# Patient Record
Sex: Female | Born: 1965 | Race: White | Hispanic: No | Marital: Married | Smoking: Current every day smoker
Health system: Southern US, Community
[De-identification: ages and names within clinical notes are randomized; demographics above are authoritative.]

## PROBLEM LIST (undated history)

## (undated) DIAGNOSIS — D649 Anemia, unspecified: Secondary | ICD-10-CM

## (undated) HISTORY — PX: CHOLECYSTECTOMY: SHX55

## (undated) HISTORY — PX: ABDOMINAL HYSTERECTOMY: SHX81

---

## 1997-12-14 ENCOUNTER — Other Ambulatory Visit: Admission: RE | Admit: 1997-12-14 | Discharge: 1997-12-14 | Payer: Self-pay | Admitting: Obstetrics & Gynecology

## 1998-02-26 ENCOUNTER — Inpatient Hospital Stay (HOSPITAL_COMMUNITY): Admission: AD | Admit: 1998-02-26 | Discharge: 1998-02-26 | Payer: Self-pay | Admitting: Obstetrics & Gynecology

## 1998-05-09 ENCOUNTER — Inpatient Hospital Stay (HOSPITAL_COMMUNITY): Admission: AD | Admit: 1998-05-09 | Discharge: 1998-05-11 | Payer: Self-pay | Admitting: Obstetrics and Gynecology

## 1998-06-21 ENCOUNTER — Other Ambulatory Visit: Admission: RE | Admit: 1998-06-21 | Discharge: 1998-06-21 | Payer: Self-pay | Admitting: Obstetrics & Gynecology

## 2001-08-04 ENCOUNTER — Other Ambulatory Visit: Admission: RE | Admit: 2001-08-04 | Discharge: 2001-08-04 | Payer: Self-pay | Admitting: Obstetrics & Gynecology

## 2002-08-31 ENCOUNTER — Observation Stay (HOSPITAL_COMMUNITY): Admission: RE | Admit: 2002-08-31 | Discharge: 2002-09-01 | Payer: Self-pay | Admitting: Obstetrics & Gynecology

## 2006-07-22 ENCOUNTER — Emergency Department: Payer: Self-pay | Admitting: Emergency Medicine

## 2014-05-17 ENCOUNTER — Emergency Department: Payer: Self-pay | Admitting: Emergency Medicine

## 2015-09-27 ENCOUNTER — Emergency Department (HOSPITAL_COMMUNITY)
Admission: EM | Admit: 2015-09-27 | Discharge: 2015-09-27 | Disposition: A | Discharge status: Home / Self Care | Payer: Self-pay | Attending: Emergency Medicine | Admitting: Emergency Medicine

## 2015-09-27 ENCOUNTER — Encounter (HOSPITAL_COMMUNITY): Payer: Self-pay

## 2015-09-27 DIAGNOSIS — F17200 Nicotine dependence, unspecified, uncomplicated: Secondary | ICD-10-CM | POA: Insufficient documentation

## 2015-09-27 DIAGNOSIS — R319 Hematuria, unspecified: Secondary | ICD-10-CM

## 2015-09-27 DIAGNOSIS — N390 Urinary tract infection, site not specified: Secondary | ICD-10-CM | POA: Insufficient documentation

## 2015-09-27 DIAGNOSIS — R109 Unspecified abdominal pain: Secondary | ICD-10-CM

## 2015-09-27 LAB — URINALYSIS, ROUTINE W REFLEX MICROSCOPIC
Bilirubin Urine: NEGATIVE
Glucose, UA: NEGATIVE mg/dL
Ketones, ur: NEGATIVE mg/dL
LEUKOCYTES UA: NEGATIVE
NITRITE: POSITIVE — AB
PROTEIN: NEGATIVE mg/dL
SPECIFIC GRAVITY, URINE: 1.015 (ref 1.005–1.030)
pH: 5.5 (ref 5.0–8.0)

## 2015-09-27 LAB — URINE MICROSCOPIC-ADD ON

## 2015-09-27 MED ORDER — CEPHALEXIN 500 MG PO CAPS: 500.0000 mg | ORAL_CAPSULE | Freq: Four times a day (QID) | ORAL | Status: DC

## 2015-09-27 MED ORDER — HYDROCODONE-ACETAMINOPHEN 5-325 MG PO TABS: 1.0000 | ORAL_TABLET | ORAL | Status: DC | PRN

## 2015-09-27 NOTE — ED Notes (Signed)
Pt states he understands instructions. Home stable with family. 

## 2015-09-27 NOTE — ED Provider Notes (Signed)
CSN: [id number]     Arrival date & time 09/27/15  0741 History   First MD Initiated Contact with Patient 09/27/15 0932     Chief Complaint  Patient presents with  . Flank Pain  . Hematuria     (Consider location/radiation/quality/duration/timing/severity/associated sxs/prior Treatment) Patient is a 50 y.o. female presenting with flank pain and hematuria. The history is provided by the patient and medical records.  Flank Pain  Hematuria  50 year old female with no significant past medical history presenting to the ED for hematuria and left flank pain. Patient reports yesterday morning she got up from bed and went to urinate and noticed some blood in her urine. She denies any passage of clots. She states this is cleared over the past day but now she has some dull, aching, left flank pain. She denies any fever or chills.  No nausea/vomiting.  No diarrhea. She does report some pressure when urinating but states this is not that abnormal for her due to her prior hysterectomy.  She denies hx of kidney stones.  No prior abdominal surgery. Patient reports similar symptoms in the past and was diagnosed with a UTI.  History reviewed. No pertinent past medical history. History reviewed. No pertinent past surgical history. No family history on file. Social History  Substance Use Topics  . Smoking status: Current Every Day Smoker  . Smokeless tobacco: None  . Alcohol Use: None   OB History    No data available     Review of Systems  Genitourinary: Positive for hematuria and flank pain.  All other systems reviewed and are negative.     Allergies  Review of patient's allergies indicates no known allergies.  Home Medications   Prior to Admission medications   Not on File   BP 131/76 mmHg  Pulse 102  Temp(Src) 97.7 F (36.5 C) (Oral)  Resp 18  Ht 5\' 3"  (1.6 m)  SpO2 97%   Physical Exam  Constitutional: She is oriented to person, place, and time. She appears well-developed and  well-nourished.  Comfortable during exam, no distress  HENT:  Head: Normocephalic and atraumatic.  Mouth/Throat: Oropharynx is clear and moist.  Eyes: Conjunctivae and EOM are normal. Pupils are equal, round, and reactive to light.  Neck: Normal range of motion.  Cardiovascular: Normal rate, regular rhythm and normal heart sounds.   Pulmonary/Chest: Effort normal and breath sounds normal. No respiratory distress. She has no wheezes.  Abdominal: Soft. Bowel sounds are normal. There is no tenderness. There is CVA tenderness (mild, left).  Musculoskeletal: Normal range of motion.  Neurological: She is alert and oriented to person, place, and time.  Skin: Skin is warm and dry.  Psychiatric: She has a normal mood and affect.  Nursing note and vitals reviewed.   ED Course  Procedures (including critical care time) Labs Review Labs Reviewed  URINALYSIS, ROUTINE W REFLEX MICROSCOPIC (NOT AT [facility]) - Abnormal; Notable for the following:    APPearance CLOUDY (*)    Hgb urine dipstick LARGE (*)    Nitrite POSITIVE (*)    All other components within normal limits  URINE MICROSCOPIC-ADD ON - Abnormal; Notable for the following:    Squamous Epithelial / LPF 6-30 (*)    Bacteria, UA MANY (*)    Crystals CA OXALATE CRYSTALS (*)    All other components within normal limits    Imaging Review No results found. I have personally reviewed and evaluated these images and lab results as part of my medical decision-making.  EKG Interpretation None      MDM   Final diagnoses:  UTI (lower urinary tract infection)  Hematuria  Flank pain   50 year old female here with hematuria yesterday and now left flank pain. Patient is afebrile, nontoxic. Her vital signs are stable. She appears comfortable.  She has mild left CVA tenderness on exam. UA is infectious with many bacteria, nitrite positive. There some blood noted, however this is likely due to her concurrent infection.  Patient has no history of  kidney stones, is otherwise healthy, and hemodynamically stable here in the ED.  Will start on Keflex for possibly developing pyelonephritis. Rx Vicodin for pain. Encouraged to follow-up with her primary care doctor.  Discussed plan with patient, he/she acknowledged understanding and agreed with plan of care.  Return precautions given for new or worsening symptoms.  Garlon Hatchet, PA-C 09/27/15 1032  Garlon Hatchet, PA-C 09/27/15 1037  Pricilla Loveless, MD 09/30/15 [phone]

## 2015-09-27 NOTE — ED Notes (Addendum)
Patient complains of 1 episode of hematuria yesterday and this am 0300 developed left flank pain. Pain worse with any movement. No nausea, no vomiting

## 2015-09-27 NOTE — Discharge Instructions (Signed)
Take the prescribed medication as directed.  Use caution when taking vicodin-- it can make you sleepy/drowsy. Follow-up with your primary care doctor. Return to the ED for new or worsening symptoms.

## 2015-09-29 LAB — URINE CULTURE: Culture: >=100000 — AB

## 2015-09-30 ENCOUNTER — Telehealth (HOSPITAL_BASED_OUTPATIENT_CLINIC_OR_DEPARTMENT_OTHER): Payer: Self-pay

## 2015-09-30 NOTE — Telephone Encounter (Signed)
Post ED Visit - Positive Culture Follow-up  Culture report reviewed by antimicrobial stewardship pharmacist:  []  Martha Welch, Pharm.D. []  Martha Welch, Pharm.D., BCPS []  Martha Welch, Pharm.D. []  Martha Welch, Pharm.D., BCPS [x]  Martha Welch, [address] BoulevardPharm.D., BCPS, AAHIVP []  Martha Welch, Pharm.D., BCPS, AAHIVP []  Martha Welch, Pharm.D. []  Martha Welch, [place]D.  Positive urine culture Treated with Cephalexin, organism sensitive to the same and no further patient follow-up is required at this time.  Martha Welch, Martha Welch 09/30/2015, 10:35 AM

## 2016-10-31 ENCOUNTER — Emergency Department: Payer: No Typology Code available for payment source

## 2016-10-31 ENCOUNTER — Emergency Department
Admission: EM | Admit: 2016-10-31 | Discharge: 2016-10-31 | Disposition: A | Discharge status: Home / Self Care | Payer: No Typology Code available for payment source | Attending: Emergency Medicine | Admitting: Emergency Medicine

## 2016-10-31 DIAGNOSIS — Y939 Activity, unspecified: Secondary | ICD-10-CM | POA: Insufficient documentation

## 2016-10-31 DIAGNOSIS — F17200 Nicotine dependence, unspecified, uncomplicated: Secondary | ICD-10-CM | POA: Diagnosis not present

## 2016-10-31 DIAGNOSIS — N132 Hydronephrosis with renal and ureteral calculous obstruction: Secondary | ICD-10-CM | POA: Diagnosis not present

## 2016-10-31 DIAGNOSIS — Y929 Unspecified place or not applicable: Secondary | ICD-10-CM | POA: Insufficient documentation

## 2016-10-31 DIAGNOSIS — S39012A Strain of muscle, fascia and tendon of lower back, initial encounter: Secondary | ICD-10-CM | POA: Diagnosis not present

## 2016-10-31 DIAGNOSIS — Y998 Other external cause status: Secondary | ICD-10-CM | POA: Insufficient documentation

## 2016-10-31 DIAGNOSIS — S3992XA Unspecified injury of lower back, initial encounter: Secondary | ICD-10-CM | POA: Diagnosis present

## 2016-10-31 HISTORY — DX: Anemia, unspecified: D64.9

## 2016-10-31 LAB — URINALYSIS, COMPLETE (UACMP) WITH MICROSCOPIC
BACTERIA UA: NONE SEEN
BILIRUBIN URINE: NEGATIVE
Glucose, UA: NEGATIVE mg/dL
KETONES UR: NEGATIVE mg/dL
LEUKOCYTES UA: NEGATIVE
NITRITE: NEGATIVE
PH: 5 (ref 5.0–8.0)
Protein, ur: 30 mg/dL — AB
Specific Gravity, Urine: 1.015 (ref 1.005–1.030)

## 2016-10-31 MED ORDER — TAMSULOSIN HCL 0.4 MG PO CAPS: 0.4000 mg | ORAL_CAPSULE | Freq: Every day | ORAL | 0 refills | Status: AC

## 2016-10-31 MED ORDER — KETOROLAC TROMETHAMINE 30 MG/ML IJ SOLN
30.0000 mg | Freq: Once | INTRAMUSCULAR | Status: AC
  Administered 2016-10-31: 30 mg via INTRAMUSCULAR
  Filled 2016-10-31: qty 1

## 2016-10-31 MED ORDER — CYCLOBENZAPRINE HCL 5 MG PO TABS: 5.0000 mg | ORAL_TABLET | Freq: Three times a day (TID) | ORAL | 0 refills | Status: DC | PRN

## 2016-10-31 MED ORDER — CIPROFLOXACIN HCL 500 MG PO TABS: 500.0000 mg | ORAL_TABLET | Freq: Two times a day (BID) | ORAL | 0 refills | Status: AC

## 2016-10-31 MED ORDER — KETOROLAC TROMETHAMINE 10 MG PO TABS: 10.0000 mg | ORAL_TABLET | Freq: Four times a day (QID) | ORAL | 0 refills | Status: DC | PRN
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
## 2018-12-05 ENCOUNTER — Emergency Department (HOSPITAL_COMMUNITY)
Admission: EM | Admit: 2018-12-05 | Discharge: 2018-12-05 | Disposition: A | Discharge status: Home / Self Care | Payer: Self-pay | Attending: Emergency Medicine | Admitting: Emergency Medicine

## 2018-12-05 ENCOUNTER — Encounter (HOSPITAL_COMMUNITY): Payer: Self-pay | Admitting: Emergency Medicine

## 2018-12-05 ENCOUNTER — Emergency Department (HOSPITAL_COMMUNITY): Payer: Self-pay

## 2018-12-05 ENCOUNTER — Other Ambulatory Visit: Payer: Self-pay

## 2018-12-05 DIAGNOSIS — Z5321 Procedure and treatment not carried out due to patient leaving prior to being seen by health care provider: Secondary | ICD-10-CM | POA: Insufficient documentation

## 2018-12-05 LAB — CBC
HCT: 44.9 % (ref 36.0–46.0)
Hemoglobin: 14.3 g/dL (ref 12.0–15.0)
MCH: 28 pg (ref 26.0–34.0)
MCHC: 31.8 g/dL (ref 30.0–36.0)
MCV: 87.9 fL (ref 80.0–100.0)
Platelets: 306 10*3/uL (ref 150–400)
RBC: 5.11 MIL/uL (ref 3.87–5.11)
RDW: 15.1 % (ref 11.5–15.5)
WBC: 15.1 10*3/uL — ABNORMAL HIGH (ref 4.0–10.5)
nRBC: 0 % (ref 0.0–0.2)

## 2018-12-05 LAB — BASIC METABOLIC PANEL
Anion gap: 9 (ref 5–15)
BUN: 6 mg/dL (ref 6–20)
CO2: 23 mmol/L (ref 22–32)
Calcium: 8.9 mg/dL (ref 8.9–10.3)
Chloride: 110 mmol/L (ref 98–111)
Creatinine, Ser: 0.72 mg/dL (ref 0.44–1.00)
GFR calc Af Amer: >60 mL/min (ref >60)
GFR calc non Af Amer: >60 mL/min (ref >60)
Glucose, Bld: 129 mg/dL — ABNORMAL HIGH (ref 70–99)
Potassium: 3.1 mmol/L — ABNORMAL LOW (ref 3.5–5.1)
Sodium: 142 mmol/L (ref 135–145)

## 2018-12-05 LAB — TROPONIN I (HIGH SENSITIVITY): Troponin I (High Sensitivity): 6 ng/L (ref <18)
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
## 2020-02-29 ENCOUNTER — Other Ambulatory Visit: Payer: Self-pay

## 2020-02-29 ENCOUNTER — Emergency Department
Admission: EM | Admit: 2020-02-29 | Discharge: 2020-02-29 | Disposition: A | Discharge status: Home / Self Care | Payer: Self-pay | Attending: Emergency Medicine | Admitting: Emergency Medicine

## 2020-02-29 DIAGNOSIS — F17200 Nicotine dependence, unspecified, uncomplicated: Secondary | ICD-10-CM | POA: Insufficient documentation

## 2020-02-29 DIAGNOSIS — H66003 Acute suppurative otitis media without spontaneous rupture of ear drum, bilateral: Secondary | ICD-10-CM | POA: Insufficient documentation

## 2020-02-29 MED ORDER — CEFDINIR 300 MG PO CAPS: 300.0000 mg | ORAL_CAPSULE | Freq: Two times a day (BID) | ORAL | 0 refills | Status: AC

## 2020-02-29 NOTE — ED Triage Notes (Signed)
Pt here with bilateral ear infx. Pt states this happens often. Pt in triage crying due to the pain.

## 2020-02-29 NOTE — ED Provider Notes (Signed)
[facility] Emergency Department Provider Note  ____________________________________________   First MD Initiated Contact with Patient 02/29/20 1552     (approximate)  I have reviewed the triage vital signs and the nursing notes.   HISTORY  Chief Complaint Otalgia  HPI Martha Welch is a 54 y.o. female who presents to the emergency department for evaluation of bilateral ear pain that has been present since Saturday. Patient states that she has a history of bilateral ear infections, but that she has not had to take antibiotics for them in several years. She states that she has tried [facility] powder and ibuprofen without any symptom relief. Pain is rated 10/10 in the bilateral ears, states that one is not worse than the other.         Past Medical History:  Diagnosis Date  . Anemia     There are no problems to display for this patient.   Past Surgical History:  Procedure Laterality Date  . ABDOMINAL HYSTERECTOMY    . CESAREAN SECTION    . CHOLECYSTECTOMY      Prior to Admission medications   Medication Sig Start Date End Date Taking? Authorizing Provider  cefdinir (OMNICEF) 300 MG capsule Take 1 capsule (300 mg total) by mouth 2 (two) times daily for 10 days. 02/29/20 03/10/20  Lucy Chris, PA  cyclobenzaprine (FLEXERIL) 5 MG tablet Take 1 tablet (5 mg total) by mouth 3 (three) times daily as needed. 10/31/16   Little, Traci M, PA-C  HYDROcodone-acetaminophen (NORCO/VICODIN) 5-325 MG tablet Take 1 tablet by mouth every 4 (four) hours as needed. 09/27/15   Garlon Hatchet, PA-C    Allergies Patient has no known allergies.  No family history on file.  Social History Social History   Tobacco Use  . Smoking status: Current Every Day Smoker    Packs/day: 1.50  . Smokeless tobacco: Never Used  Vaping Use  . Vaping Use: Never used  Substance Use Topics  . Alcohol use: Yes    Comment: socially   . Drug use: No    Review of  Systems Constitutional: No fever/chills Eyes: No visual changes. ENT: + biLateral ear pain, no sore throat. Cardiovascular: Denies chest pain. Respiratory: Denies shortness of breath. Gastrointestinal: No abdominal pain.  No nausea, no vomiting.  No diarrhea.  No constipation. Genitourinary: Negative for dysuria. Musculoskeletal: Negative for back pain. Skin: Negative for rash. Neurological: Negative for headaches, focal weakness or numbness.  ____________________________________________   PHYSICAL EXAM:  VITAL SIGNS: ED Triage Vitals [02/29/20 1431]  Enc Vitals Group     BP (!) 158/79     Pulse Rate (!) 114     Resp 18     Temp 98.9 F (37.2 C)     Temp Source Oral     SpO2 100 %     Weight 128 lb (58.1 kg)     Height 5\' 3"  (1.6 m)     Head Circumference      Peak Flow      Pain Score 10     Pain Loc      Pain Edu?      Excl. in GC?     Constitutional: Alert and oriented. Well appearing and in no acute distress. Eyes: Conjunctivae are normal. PERRL. EOMI. Head: Atraumatic. Nose: No congestion/rhinnorhea. Ears: The left TM injected with a fluid line appreciated, but no bulging. The right TM demonstrates mild bulging with loss of landmark identification with erythematous injection. The bilateral canals are  unremarkable. Mouth/Throat: Mucous membranes are moist.  Oropharynx non-erythematous. Neck: No stridor.   Lymphatic: No cervical lymphadenopathy. Cardiovascular: Normal rate, regular rhythm. Grossly normal heart sounds.  Good peripheral circulation. Respiratory: Normal respiratory effort.  No retractions. Lungs CTAB. Neurologic:  Normal speech and language. No gait instability. Skin:  Skin is warm, dry and intact. No rash noted. Psychiatric: Mood and affect are normal. Speech and behavior are normal.  ____________________________________________   INITIAL IMPRESSION / ASSESSMENT AND PLAN / ED COURSE  As part of my medical decision making, I reviewed the  following data within the electronic MEDICAL RECORD NUMBER Nursing notes reviewed and incorporated        Patient is a 54 year old female who presents to the emergency department for evaluation of bilateral ear pain. Physical exam is consistent with bilateral acute otitis media given injection and mild bulging noted on the right. The patient states that she cannot take Augmentin because every time she takes it, she gets a yeast infection. She was offered Diflucan with this prescription but she refused it. She was then prescribed cefdinir as second line option, and is amenable with this plan. She will take ibuprofen and Tylenol supplemental for her symptoms. She will return to the emergency department if she has any worsening of symptoms.      ____________________________________________   FINAL CLINICAL IMPRESSION(S) / ED DIAGNOSES  Final diagnoses:  Non-recurrent acute suppurative otitis media of both ears without spontaneous rupture of tympanic membranes     ED Discharge Orders         Ordered    cefdinir (OMNICEF) 300 MG capsule  2 times daily        02/29/20 1605          *Please note:  Martha Welch was evaluated in Emergency Department on 02/29/2020 for the symptoms described in the history of present illness. She was evaluated in the context of the global COVID-19 pandemic, which necessitated consideration that the patient might be at risk for infection with the SARS-CoV-2 virus that causes COVID-19. Institutional protocols and algorithms that pertain to the evaluation of patients at risk for COVID-19 are in a state of rapid change based on information released by regulatory bodies including the CDC and federal and state organizations. These policies and algorithms were followed during the patient's care in the ED.  Some ED evaluations and interventions may be delayed as a result of limited staffing during and the pandemic.*   Note:  This document was prepared using Dragon voice  recognition software and may include unintentional dictation errors.    Lucy Chris, PA 02/29/20 Joseph Pierini    Shaune Pollack, MD 03/01/20 1734

## 2020-02-29 NOTE — ED Notes (Signed)
Pt discharged home after verbalizing understanding of discharge instructions; nad noted. 

## 2020-04-10 ENCOUNTER — Encounter: Payer: Self-pay | Admitting: Emergency Medicine

## 2020-04-10 ENCOUNTER — Emergency Department: Payer: Self-pay

## 2020-04-10 ENCOUNTER — Other Ambulatory Visit: Payer: Self-pay

## 2020-04-10 ENCOUNTER — Emergency Department
Admission: EM | Admit: 2020-04-10 | Discharge: 2020-04-10 | Disposition: A | Discharge status: Home / Self Care | Payer: Self-pay | Attending: Emergency Medicine | Admitting: Emergency Medicine

## 2020-04-10 DIAGNOSIS — F17200 Nicotine dependence, unspecified, uncomplicated: Secondary | ICD-10-CM | POA: Insufficient documentation

## 2020-04-10 DIAGNOSIS — R109 Unspecified abdominal pain: Secondary | ICD-10-CM

## 2020-04-10 DIAGNOSIS — N10 Acute tubulo-interstitial nephritis: Secondary | ICD-10-CM | POA: Insufficient documentation

## 2020-04-10 DIAGNOSIS — I700 Atherosclerosis of aorta: Secondary | ICD-10-CM | POA: Insufficient documentation

## 2020-04-10 DIAGNOSIS — N12 Tubulo-interstitial nephritis, not specified as acute or chronic: Secondary | ICD-10-CM

## 2020-04-10 DIAGNOSIS — Z90710 Acquired absence of both cervix and uterus: Secondary | ICD-10-CM | POA: Insufficient documentation

## 2020-04-10 DIAGNOSIS — Z9049 Acquired absence of other specified parts of digestive tract: Secondary | ICD-10-CM | POA: Insufficient documentation

## 2020-04-10 LAB — LACTIC ACID, PLASMA: Lactic Acid, Venous: 1.4 mmol/L (ref 0.5–1.9)

## 2020-04-10 LAB — CBC
HCT: 42.5 % (ref 36.0–46.0)
Hemoglobin: 13.7 g/dL (ref 12.0–15.0)
MCH: 29.2 pg (ref 26.0–34.0)
MCHC: 32.2 g/dL (ref 30.0–36.0)
MCV: 90.6 fL (ref 80.0–100.0)
Platelets: 201 10*3/uL (ref 150–400)
RBC: 4.69 MIL/uL (ref 3.87–5.11)
RDW: 14.2 % (ref 11.5–15.5)
WBC: 14.9 10*3/uL — ABNORMAL HIGH (ref 4.0–10.5)
nRBC: 0 % (ref 0.0–0.2)

## 2020-04-10 LAB — URINALYSIS, COMPLETE (UACMP) WITH MICROSCOPIC
Bilirubin Urine: NEGATIVE
Glucose, UA: NEGATIVE mg/dL
Ketones, ur: NEGATIVE mg/dL
Nitrite: POSITIVE — AB
Protein, ur: 100 mg/dL — AB
Specific Gravity, Urine: 1.012 (ref 1.005–1.030)
WBC, UA: >50 WBC/hpf — ABNORMAL HIGH (ref 0–5)
pH: 5 (ref 5.0–8.0)

## 2020-04-10 LAB — BASIC METABOLIC PANEL
Anion gap: 11 (ref 5–15)
BUN: 9 mg/dL (ref 6–20)
CO2: 20 mmol/L — ABNORMAL LOW (ref 22–32)
Calcium: 8.3 mg/dL — ABNORMAL LOW (ref 8.9–10.3)
Chloride: 104 mmol/L (ref 98–111)
Creatinine, Ser: 0.73 mg/dL (ref 0.44–1.00)
GFR, Estimated: >60 mL/min (ref >60)
Glucose, Bld: 192 mg/dL — ABNORMAL HIGH (ref 70–99)
Potassium: 3.5 mmol/L (ref 3.5–5.1)
Sodium: 135 mmol/L (ref 135–145)

## 2020-04-10 MED ORDER — SODIUM CHLORIDE 0.9 % IV BOLUS
1000.0000 mL | Freq: Once | INTRAVENOUS | Status: AC
  Administered 2020-04-10: 1000 mL via INTRAVENOUS

## 2020-04-10 MED ORDER — LACTATED RINGERS IV BOLUS: 1000.0000 mL | Freq: Once | INTRAVENOUS | Status: DC

## 2020-04-10 MED ORDER — CEPHALEXIN 500 MG PO CAPS: 500.0000 mg | ORAL_CAPSULE | Freq: Four times a day (QID) | ORAL | 0 refills | Status: AC

## 2020-04-10 MED ORDER — ACETAMINOPHEN 325 MG PO TABS
650.0000 mg | ORAL_TABLET | Freq: Once | ORAL | Status: AC
  Administered 2020-04-10: 650 mg via ORAL
  Filled 2020-04-10: qty 2

## 2020-04-10 MED ORDER — SODIUM CHLORIDE 0.9 % IV SOLN
1.0000 g | Freq: Once | INTRAVENOUS | Status: AC
  Administered 2020-04-10: 1 g via INTRAVENOUS
  Filled 2020-04-10: qty 10

## 2020-04-10 NOTE — ED Provider Notes (Signed)
[facility] Emergency Department Provider Note ____________________________________________   Event Date/Time   First MD Initiated Contact with Patient 04/10/20 1958     (approximate)  I have reviewed the triage vital signs and the nursing notes.  HISTORY  Chief Complaint Flank Pain   HPI Martha Welch is a 54 y.o. femalewho presents to the ED for evaluation of flank pain.  Chart review indicates history of ureteral stones.  Patient reports being in her typical state of health until developing right-sided flank pain upon awakening this morning.  She denies any dysuria, hematuria, frontal abdominal pain, emesis, diarrhea, vaginal discharge or bleeding.  Denies any subjective fevers or chills at home, but was noted to be febrile in triage.  Currently reports 2/10 right-sided flank pain is her only complaint.  Patient reports last finishing antibiotics greater than 1 month ago for a "double ear infection."  11/15 ED visit for this noted.   Past Medical History:  Diagnosis Date  . Anemia     There are no problems to display for this patient.   Past Surgical History:  Procedure Laterality Date  . ABDOMINAL HYSTERECTOMY    . CESAREAN SECTION    . CHOLECYSTECTOMY      Prior to Admission medications   Medication Sig Start Date End Date Taking? Authorizing Provider  cephALEXin (KEFLEX) 500 MG capsule Take 1 capsule (500 mg total) by mouth 4 (four) times daily for 7 days. 04/10/20 04/17/20  Delton Prairie, MD  cyclobenzaprine (FLEXERIL) 5 MG tablet Take 1 tablet (5 mg total) by mouth 3 (three) times daily as needed. 10/31/16   Little, Traci M, PA-C  HYDROcodone-acetaminophen (NORCO/VICODIN) 5-325 MG tablet Take 1 tablet by mouth every 4 (four) hours as needed. 09/27/15   Garlon Hatchet, PA-C    Allergies Amoxicillin  No family history on file.  Social History Social History   Tobacco Use  . Smoking status: Current Every Day Smoker    Packs/day:  1.50  . Smokeless tobacco: Never Used  Vaping Use  . Vaping Use: Never used  Substance Use Topics  . Alcohol use: Yes    Comment: socially   . Drug use: No    Review of Systems  Constitutional: No fever/chills Eyes: No visual changes. ENT: No sore throat. Cardiovascular: Denies chest pain. Respiratory: Denies shortness of breath. Gastrointestinal: No abdominal pain.  No nausea, no vomiting.  No diarrhea.  No constipation. Positive for right-sided flank pain. Genitourinary: Negative for dysuria. Musculoskeletal: Negative for back pain. Skin: Negative for rash. Neurological: Negative for headaches, focal weakness or numbness.  ____________________________________________   PHYSICAL EXAM:  VITAL SIGNS: Vitals:   04/10/20 1645 04/10/20 2001  BP: 140/75 101/65  Pulse: (!) 120 99  Resp: 20 16  Temp: (!) 101.4 F (38.6 C) 98.2 F (36.8 C)  SpO2: 96% 99%     Constitutional: Alert and oriented. Well appearing and in no acute distress.  Sitting up on the side of the bed, conversational in full sentences without distress. Eyes: Conjunctivae are normal. PERRL. EOMI. Head: Atraumatic. Nose: No congestion/rhinnorhea. Mouth/Throat: Mucous membranes are dry.  Oropharynx non-erythematous. Neck: No stridor. No cervical spine tenderness to palpation. Cardiovascular: Tachycardic rate, regular rhythm. Grossly normal heart sounds.  Good peripheral circulation. Respiratory: Normal respiratory effort.  No retractions. Lungs CTAB. Gastrointestinal: Soft , nondistended, nontender to palpation. No CVA tenderness. Musculoskeletal: No lower extremity tenderness nor edema.  No joint effusions. No signs of acute trauma. Neurologic:  Normal speech and language.  No gross focal neurologic deficits are appreciated. No gait instability noted. Skin:  Skin is warm, dry and intact. No rash noted. Psychiatric: Mood and affect are normal. Speech and behavior are  normal.  ____________________________________________   LABS (all labs ordered are listed, but only abnormal results are displayed)  Labs Reviewed  URINALYSIS, COMPLETE (UACMP) WITH MICROSCOPIC - Abnormal; Notable for the following components:      Result Value   Color, Urine YELLOW (*)    APPearance CLOUDY (*)    Hgb urine dipstick SMALL (*)    Protein, ur 100 (*)    Nitrite POSITIVE (*)    Leukocytes,Ua LARGE (*)    WBC, UA >50 (*)    Bacteria, UA FEW (*)    All other components within normal limits  BASIC METABOLIC PANEL - Abnormal; Notable for the following components:   CO2 20 (*)    Glucose, Bld 192 (*)    Calcium 8.3 (*)    All other components within normal limits  CBC - Abnormal; Notable for the following components:   WBC 14.9 (*)    All other components within normal limits  URINE CULTURE  LACTIC ACID, PLASMA   ____________________________________________  RADIOLOGY  ED MD interpretation: CT renal study reviewed by me without evidence of ureterolithiasis or ureteral obstruction  Official radiology report(s): CT Renal Stone Study  Result Date: 04/10/2020 CLINICAL DATA:  Right flank pain. EXAM: CT ABDOMEN AND PELVIS WITHOUT CONTRAST TECHNIQUE: Multidetector CT imaging of the abdomen and pelvis was performed following the standard protocol without IV contrast. COMPARISON:  October 31, 2016 FINDINGS: Lower chest: No acute abnormality. Hepatobiliary: No focal liver abnormality is seen. Status post cholecystectomy. No biliary dilatation. Pancreas: Unremarkable. No pancreatic ductal dilatation or surrounding inflammatory changes. Spleen: Normal in size without focal abnormality. Adrenals/Urinary Tract: Adrenal glands are unremarkable. Kidneys are normal in size, without focal lesions. There is no evidence of hydronephrosis or renal calculi. Moderate to marked severity right-sided perinephric inflammatory fat stranding is seen. Bladder is unremarkable. Stomach/Bowel: Stomach  is within normal limits. Appendix appears normal. No evidence of bowel wall thickening, distention, or inflammatory changes. Vascular/Lymphatic: Aortic atherosclerosis. Subcentimeter para-aortic lymph nodes are seen. Reproductive: Status post hysterectomy. No adnexal masses. Other: No abdominal wall hernia or abnormality. No abdominopelvic ascites. Musculoskeletal: No acute or significant osseous findings. IMPRESSION: 1. Right-sided perinephric inflammatory fat stranding which may be secondary to recent renal obstruction. Sequelae associated with acute pyelonephritis cannot be excluded. Correlation with urinalysis is recommended. 2. Evidence of prior cholecystectomy and hysterectomy. 3. Aortic atherosclerosis. Aortic Atherosclerosis (ICD10-I70.0). Electronically Signed   By: Aram Candela M.D.   On: 04/10/2020 20:26    ____________________________________________   PROCEDURES and INTERVENTIONS  Procedure(s) performed (including Critical Care):  .1-3 Lead EKG Interpretation Performed by: Delton Prairie, MD Authorized by: Delton Prairie, MD     Interpretation: abnormal     ECG rate:  106   ECG rate assessment: tachycardic     Rhythm: sinus tachycardia     Ectopy: none     Conduction: normal      Medications  acetaminophen (TYLENOL) tablet 650 mg (650 mg Oral Given by Other 04/10/20 1702)  cefTRIAXone (ROCEPHIN) 1 g in sodium chloride 0.9 % 100 mL IVPB (1 g Intravenous New Bag/Given 04/10/20 2032)  sodium chloride 0.9 % bolus 1,000 mL (1,000 mLs Intravenous New Bag/Given 04/10/20 2043)    ____________________________________________   MDM / ED COURSE   54 year old woman presents to the ED with evidence of pyelonephritis  amenable to outpatient management.  Patient febrile and tachycardic on arrival, resolving after IVF and Tylenol, otherwise hemodynamically stable.  Exam is quite reassuring without evidence of acute derangements.  She has a benign abdomen without CVA tenderness, she  looks well without distress.  Blood work with leukocytosis, but no lactic acidosis.  No evidence of sepsis caused by this pyelonephritis.  Her urine is infectious and was sent for culture.  Patient received a single dose of Rocephin in the ED to treat this.  Due to her history of ureterolithiasis, CT imaging obtained and without evidence of stones or ureteral obstruction.  No evidence of pathology to preclude outpatient management.  Discussed return precautions for the ED.  Patient medically stable for discharge home.  Clinical Course as of 04/10/20 2102  Sun Apr 10, 2020  2036 Educated patient on reassuring CT. [DS]    Clinical Course User Index [DS] Delton Prairie, MD    ____________________________________________   FINAL CLINICAL IMPRESSION(S) / ED DIAGNOSES  Final diagnoses:  Pyelonephritis  Right flank pain     ED Discharge Orders         Ordered    cephALEXin (KEFLEX) 500 MG capsule  4 times daily        04/10/20 2101           Dylynn Ketner   Note:  This document was prepared using Dragon voice recognition software and may include unintentional dictation errors.   Delton Prairie, MD 04/10/20 2103

## 2020-04-10 NOTE — ED Notes (Signed)
Pt states that she has a hx of stones. Declines an IV at this time, states that she would rather wait for provider to tell her she needs one.

## 2020-04-10 NOTE — Discharge Instructions (Signed)
You were seen in the ED because of your flank pain.  You have evidence of pyelonephritis, or a UTI that is crawled up to the kidney on the right side.  No kidney stones on the CT scan.  You're being discharged with a prescription for Keflex antibiotics to take 4 times daily for the next 7 days to treat this infection.   Please finish all 28 pills, even if your symptoms are getting better.  Please take Tylenol and ibuprofen/Advil for your pain.  It is safe to take them together, or to alternate them every few hours.  Take up to 1000mg  of Tylenol at a time, up to 4 times per day.  Do not take more than 4000 mg of Tylenol in 24 hours.  For ibuprofen, take 400-600 mg, 4-5 times per day.  Return to the ED with any worsening symptoms despite these medications.

## 2020-04-10 NOTE — ED Triage Notes (Signed)
Pt reports this am started with sudden sharp right flank and back pain. Pt states a hx of kidney stones and feels the same

## 2020-04-12 LAB — URINE CULTURE: Culture: >=100000 — AB

## 2020-04-13 NOTE — Progress Notes (Signed)
ED Antimicrobial Stewardship Positive Culture Follow Up   Martha Welch is an 54 y.o. female who presented to [facility] on 04/10/2020 with a chief complaint of flank pain Chief Complaint  Patient presents with  . Flank Pain    Recent Results (from the past 720 hour(s))  Urine Culture     Status: Abnormal   Collection Time: 04/10/20  4:48 PM   Specimen: Urine, Random  Result Value Ref Range Status   Specimen Description   Final    URINE, RANDOM Performed at [facility], [address], [place], [place] [address]    Special Requests   Final    NONE Performed at [facility], [address] [place], [place] [address]    Culture (A)  Final    >=100,000 COLONIES/mL ESCHERICHIA COLI Confirmed Extended Spectrum Beta-Lactamase Producer (ESBL).  In bloodstream infections from ESBL organisms, carbapenems are preferred over piperacillin/tazobactam. They are shown to have a lower risk of mortality.    Report Status 04/12/2020 FINAL  Final   Organism ID, Bacteria ESCHERICHIA COLI (A)  Final      Susceptibility   Escherichia coli - MIC*    AMPICILLIN >=32 RESISTANT Resistant     CEFAZOLIN >=64 RESISTANT Resistant     CEFEPIME 16 RESISTANT Resistant     CEFTRIAXONE >=64 RESISTANT Resistant     CIPROFLOXACIN >=4 RESISTANT Resistant     GENTAMICIN <=1 SENSITIVE Sensitive     IMIPENEM <=0.25 SENSITIVE Sensitive     NITROFURANTOIN <=16 SENSITIVE Sensitive     TRIMETH/SULFA >=320 RESISTANT Resistant     AMPICILLIN/SULBACTAM 8 SENSITIVE Sensitive     PIP/TAZO <=4 SENSITIVE Sensitive     * >=100,000 COLONIES/mL ESCHERICHIA COLI    [x]  Treated with cephalexin, organism resistant to prescribed antimicrobial []  Patient discharged originally without antimicrobial agent and treatment is now indicated  New antibiotic prescription: Called patient and left VM to ask patient to return to ED.  No PO option for ESBL E. Coli pyelonephritis.    ED Provider: Dr    , PharmD, BCPS.   Work Cell: [phone] 04/13/2020 12:22 PM

## 2021-03-14 IMAGING — CT CT RENAL STONE PROTOCOL
2 of 4 series · 16 of 46 positions shown, 18 images · non-contrast
Comparison: October 31, 2016

CLINICAL DATA: Right flank pain.

EXAM:
CT ABDOMEN AND PELVIS WITHOUT CONTRAST
TECHNIQUE: Multidetector CT imaging of the abdomen and pelvis was performed
following the standard protocol without IV contrast.

[Series 2: stone full standard · axial · 0.78mm/px · z∈[-1045,-610]mm · 13 of 97 slices shown, 15 images]
[im 5/97  soft-tissue]
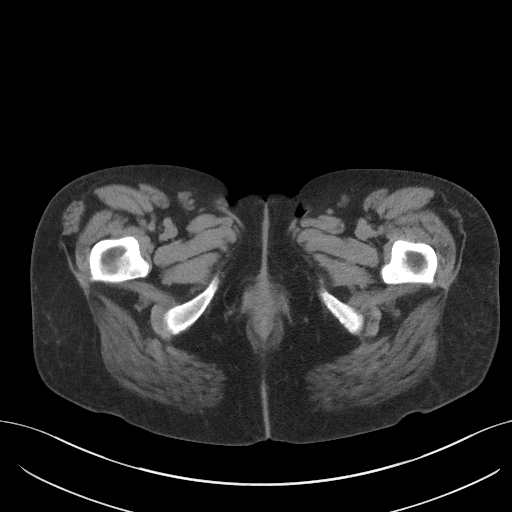
[im 5/97  bone]
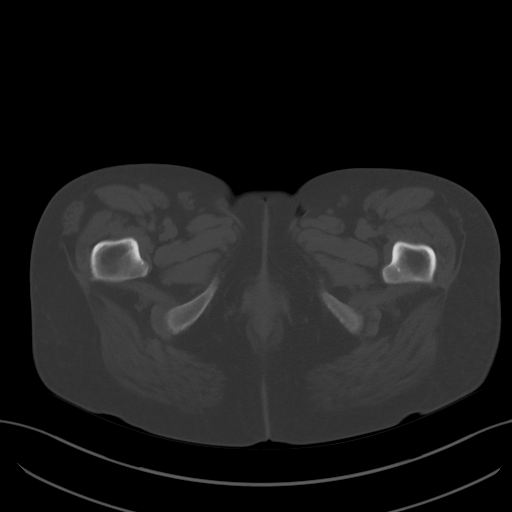
[im 14/97  soft-tissue]
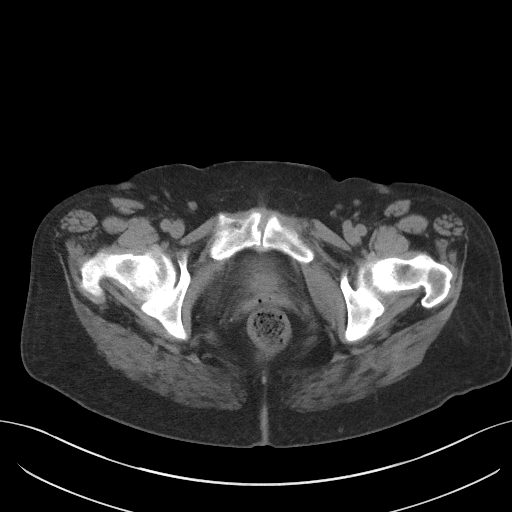
[im 22/97  soft-tissue]
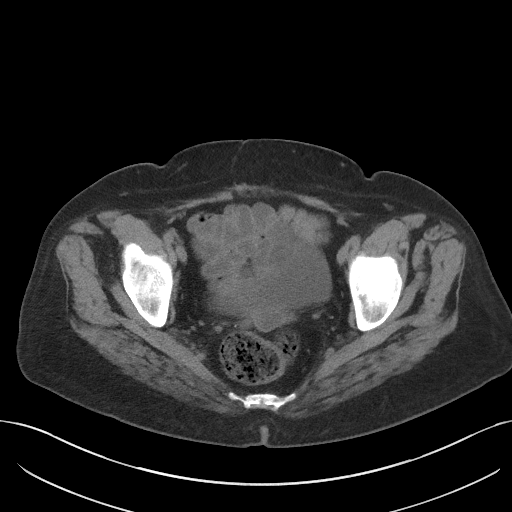
[im 27/97  soft-tissue]
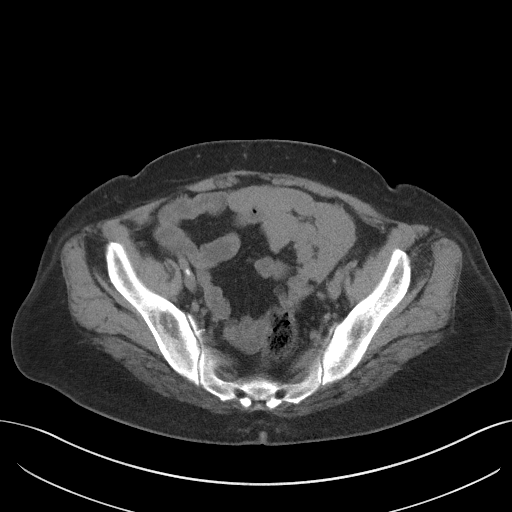
[im 35/97  soft-tissue]
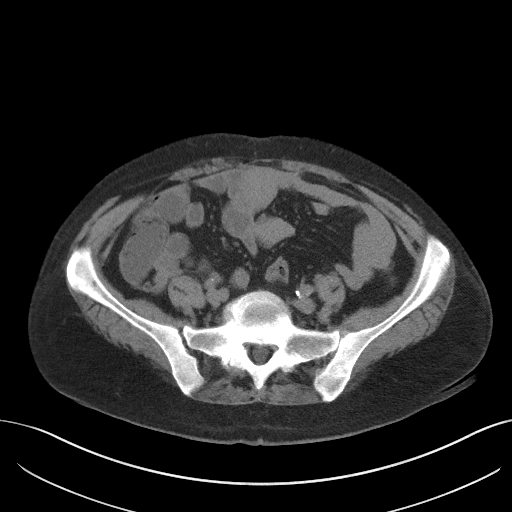
[im 40/97  soft-tissue]
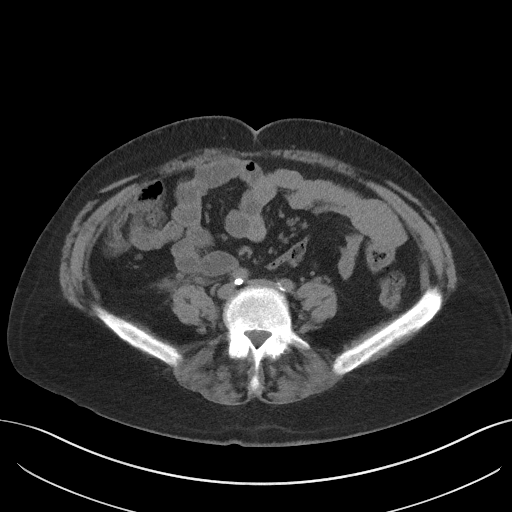
[im 49/97  soft-tissue]
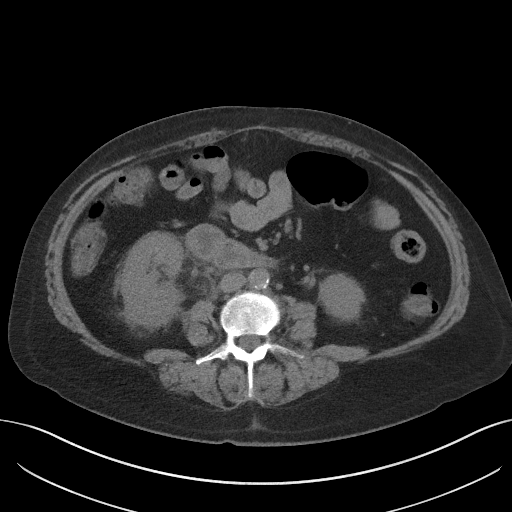
[im 57/97  soft-tissue]
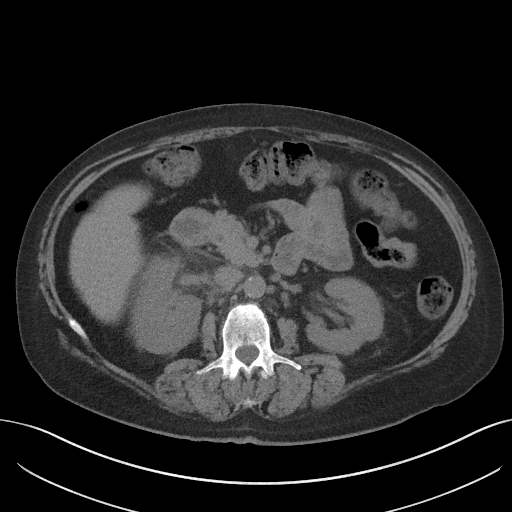
[im 62/97  soft-tissue]
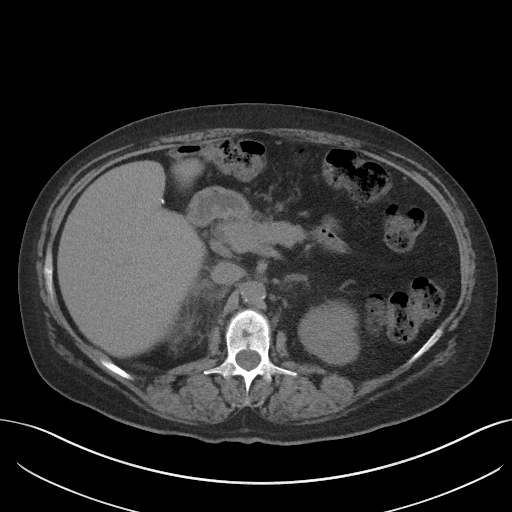
[im 62/97  bone]
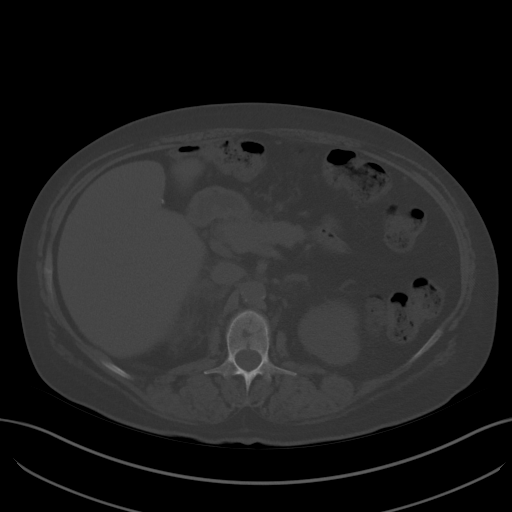
[im 70/97  soft-tissue]
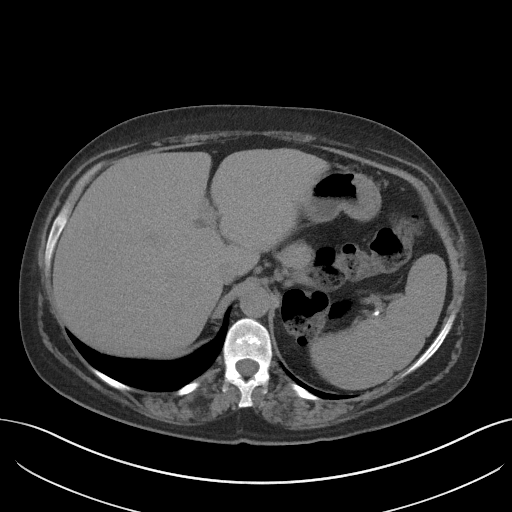
[im 75/97  soft-tissue]
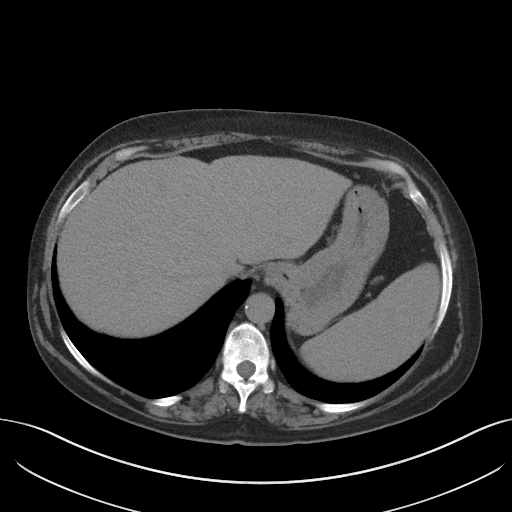
[im 83/97  soft-tissue]
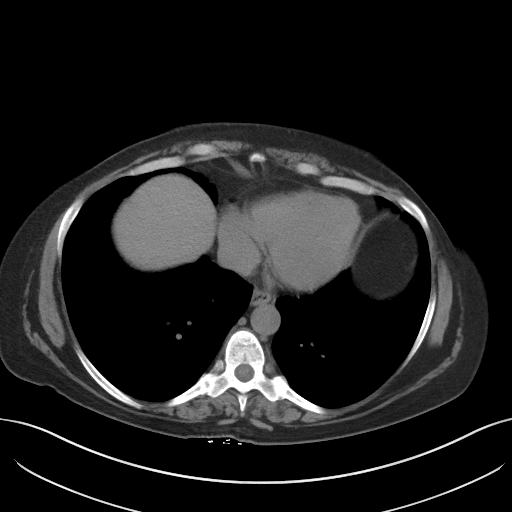
[im 92/97  soft-tissue]
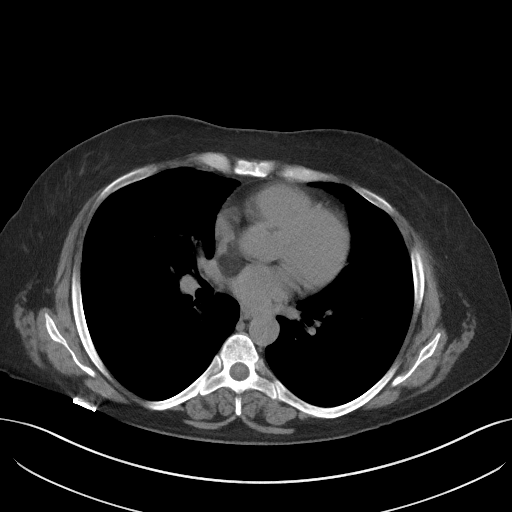

[Series 5: coronal · coronal · 0.74mm/px · 3 of 134 slices shown]
[im 45/134  soft-tissue]
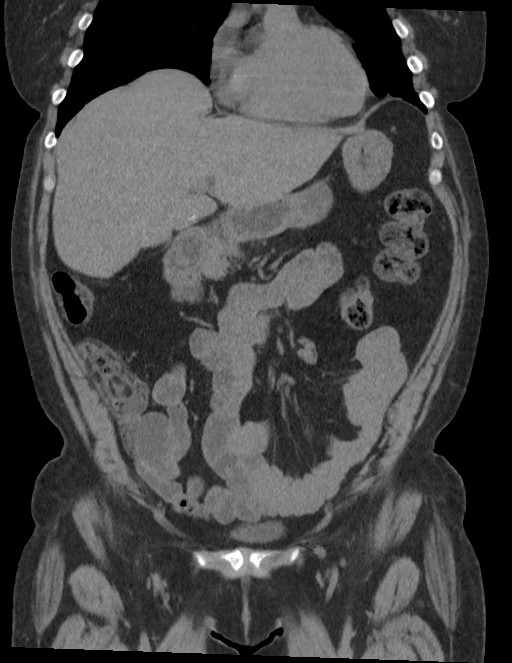
[im 60/134  soft-tissue]
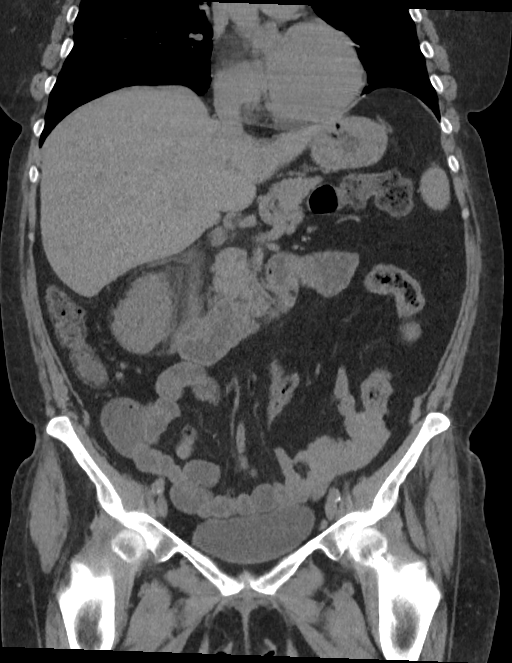
[im 74/134  soft-tissue]
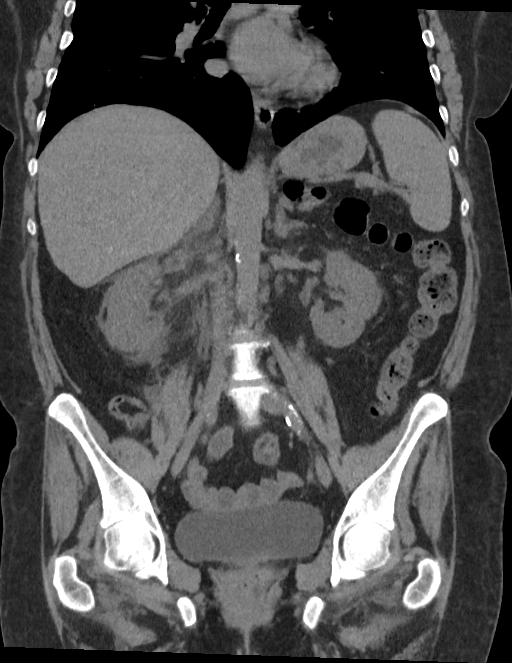

[16 of 46 positions shown; findings below may reference images not displayed]

FINDINGS: Lower chest: No acute abnormality.

Hepatobiliary: No focal liver abnormality is seen. Status post
cholecystectomy. No biliary dilatation.

Pancreas: Unremarkable. No pancreatic ductal dilatation or
surrounding inflammatory changes.

Spleen: Normal in size without focal abnormality.

Adrenals/Urinary Tract: Adrenal glands are unremarkable. Kidneys are
normal in size, without focal lesions. There is no evidence of
hydronephrosis or renal calculi. Moderate to marked severity
right-sided perinephric inflammatory fat stranding is seen. Bladder
is unremarkable.

Stomach/Bowel: Stomach is within normal limits. Appendix appears
normal. No evidence of bowel wall thickening, distention, or
inflammatory changes.

Vascular/Lymphatic: Aortic atherosclerosis. Subcentimeter
para-aortic lymph nodes are seen.

Reproductive: Status post hysterectomy. No adnexal masses.

Other: No abdominal wall hernia or abnormality. No abdominopelvic
ascites.

Musculoskeletal: No acute or significant osseous findings.
IMPRESSION: 1. Right-sided perinephric inflammatory fat stranding which may be
secondary to recent renal obstruction. Sequelae associated with
acute pyelonephritis cannot be excluded. Correlation with urinalysis
is recommended.
2. Evidence of prior cholecystectomy and hysterectomy.
3. Aortic atherosclerosis.

Aortic Atherosclerosis ([id number]).

## 2022-02-08 ENCOUNTER — Emergency Department: Payer: Self-pay

## 2022-02-08 ENCOUNTER — Inpatient Hospital Stay
Admission: EM | Admit: 2022-02-08 | Discharge: 2022-02-10 | DRG: 660 | Disposition: A | Discharge status: Home / Self Care | Payer: Self-pay | Attending: Internal Medicine | Admitting: Internal Medicine

## 2022-02-08 ENCOUNTER — Encounter: Payer: Self-pay | Admitting: Emergency Medicine

## 2022-02-08 ENCOUNTER — Other Ambulatory Visit: Payer: Self-pay

## 2022-02-08 DIAGNOSIS — R7303 Prediabetes: Secondary | ICD-10-CM | POA: Diagnosis present

## 2022-02-08 DIAGNOSIS — Z880 Allergy status to penicillin: Secondary | ICD-10-CM

## 2022-02-08 DIAGNOSIS — N202 Calculus of kidney with calculus of ureter: Secondary | ICD-10-CM | POA: Diagnosis present

## 2022-02-08 DIAGNOSIS — Z1612 Extended spectrum beta lactamase (ESBL) resistance: Secondary | ICD-10-CM | POA: Diagnosis present

## 2022-02-08 DIAGNOSIS — N136 Pyonephrosis: Principal | ICD-10-CM | POA: Diagnosis present

## 2022-02-08 DIAGNOSIS — R682 Dry mouth, unspecified: Secondary | ICD-10-CM | POA: Diagnosis present

## 2022-02-08 DIAGNOSIS — R739 Hyperglycemia, unspecified: Secondary | ICD-10-CM | POA: Diagnosis present

## 2022-02-08 DIAGNOSIS — N200 Calculus of kidney: Secondary | ICD-10-CM

## 2022-02-08 DIAGNOSIS — N12 Tubulo-interstitial nephritis, not specified as acute or chronic: Secondary | ICD-10-CM | POA: Diagnosis present

## 2022-02-08 DIAGNOSIS — F17210 Nicotine dependence, cigarettes, uncomplicated: Secondary | ICD-10-CM | POA: Diagnosis present

## 2022-02-08 DIAGNOSIS — E876 Hypokalemia: Secondary | ICD-10-CM

## 2022-02-08 DIAGNOSIS — N179 Acute kidney failure, unspecified: Secondary | ICD-10-CM | POA: Diagnosis present

## 2022-02-08 DIAGNOSIS — H9203 Otalgia, bilateral: Secondary | ICD-10-CM | POA: Diagnosis present

## 2022-02-08 DIAGNOSIS — B9620 Unspecified Escherichia coli [E. coli] as the cause of diseases classified elsewhere: Secondary | ICD-10-CM | POA: Diagnosis present

## 2022-02-08 DIAGNOSIS — N10 Acute tubulo-interstitial nephritis: Principal | ICD-10-CM

## 2022-02-08 DIAGNOSIS — Z8619 Personal history of other infectious and parasitic diseases: Secondary | ICD-10-CM

## 2022-02-08 DIAGNOSIS — N201 Calculus of ureter: Secondary | ICD-10-CM

## 2022-02-08 LAB — URINALYSIS, ROUTINE W REFLEX MICROSCOPIC
Bilirubin Urine: NEGATIVE
Glucose, UA: NEGATIVE mg/dL
Ketones, ur: 5 mg/dL — AB
Nitrite: POSITIVE — AB
Protein, ur: 100 mg/dL — AB
Specific Gravity, Urine: 1.014 (ref 1.005–1.030)
WBC, UA: >50 WBC/hpf — ABNORMAL HIGH (ref 0–5)
pH: 5 (ref 5.0–8.0)

## 2022-02-08 LAB — BASIC METABOLIC PANEL
Anion gap: 12 (ref 5–15)
BUN: 20 mg/dL (ref 6–20)
CO2: 22 mmol/L (ref 22–32)
Calcium: 8.7 mg/dL — ABNORMAL LOW (ref 8.9–10.3)
Chloride: 106 mmol/L (ref 98–111)
Creatinine, Ser: 1.06 mg/dL — ABNORMAL HIGH (ref 0.44–1.00)
GFR, Estimated: >60 mL/min (ref >60)
Glucose, Bld: 122 mg/dL — ABNORMAL HIGH (ref 70–99)
Potassium: 2.7 mmol/L — CL (ref 3.5–5.1)
Sodium: 140 mmol/L (ref 135–145)

## 2022-02-08 LAB — CBC
HCT: 47.5 % — ABNORMAL HIGH (ref 36.0–46.0)
Hemoglobin: 15.3 g/dL — ABNORMAL HIGH (ref 12.0–15.0)
MCH: 28.5 pg (ref 26.0–34.0)
MCHC: 32.2 g/dL (ref 30.0–36.0)
MCV: 88.6 fL (ref 80.0–100.0)
Platelets: 189 10*3/uL (ref 150–400)
RBC: 5.36 MIL/uL — ABNORMAL HIGH (ref 3.87–5.11)
RDW: 14.6 % (ref 11.5–15.5)
WBC: 18.3 10*3/uL — ABNORMAL HIGH (ref 4.0–10.5)
nRBC: 0 % (ref 0.0–0.2)

## 2022-02-08 LAB — LACTIC ACID, PLASMA: Lactic Acid, Venous: 1.4 mmol/L (ref 0.5–1.9)

## 2022-02-08 MED ORDER — HYDROCODONE-ACETAMINOPHEN 5-325 MG PO TABS
1.0000 | ORAL_TABLET | Freq: Four times a day (QID) | ORAL | Status: DC | PRN
  Administered 2022-02-08 – 2022-02-10 (×3): 1 via ORAL
  Filled 2022-02-08 (×3): qty 1

## 2022-02-08 MED ORDER — ONDANSETRON HCL 4 MG PO TABS
4.0000 mg | ORAL_TABLET | Freq: Four times a day (QID) | ORAL | Status: DC | PRN
  Administered 2022-02-08: 4 mg via ORAL
  Filled 2022-02-08: qty 1

## 2022-02-08 MED ORDER — POLYETHYLENE GLYCOL 3350 17 G PO PACK: 17.0000 g | PACK | Freq: Every day | ORAL | Status: DC | PRN

## 2022-02-08 MED ORDER — SODIUM CHLORIDE 0.9 % IV SOLN
2.0000 g | Freq: Once | INTRAVENOUS | Status: AC
  Administered 2022-02-08: 2 g via INTRAVENOUS
  Filled 2022-02-08: qty 20

## 2022-02-08 MED ORDER — POTASSIUM CHLORIDE CRYS ER 20 MEQ PO TBCR
40.0000 meq | EXTENDED_RELEASE_TABLET | Freq: Once | ORAL | Status: AC
  Administered 2022-02-08: 40 meq via ORAL
  Filled 2022-02-08: qty 2

## 2022-02-08 MED ORDER — ONDANSETRON HCL 4 MG/2ML IJ SOLN: 4.0000 mg | Freq: Four times a day (QID) | INTRAMUSCULAR | Status: DC | PRN

## 2022-02-08 MED ORDER — NICOTINE 21 MG/24HR TD PT24: 21.0000 mg | MEDICATED_PATCH | Freq: Every day | TRANSDERMAL | Status: DC | PRN

## 2022-02-08 MED ORDER — SODIUM CHLORIDE 0.9 % IV SOLN
1.0000 g | Freq: Once | INTRAVENOUS | Status: DC
  Filled 2022-02-08: qty 10

## 2022-02-08 MED ORDER — ONDANSETRON HCL 4 MG/2ML IJ SOLN
4.0000 mg | Freq: Once | INTRAMUSCULAR | Status: AC
  Administered 2022-02-08: 4 mg via INTRAVENOUS
  Filled 2022-02-08: qty 2

## 2022-02-08 MED ORDER — LACTATED RINGERS IV SOLN: INTRAVENOUS | Status: AC

## 2022-02-08 MED ORDER — SODIUM CHLORIDE 0.9 % IV SOLN
1.0000 g | Freq: Two times a day (BID) | INTRAVENOUS | Status: DC
  Administered 2022-02-08 – 2022-02-09 (×3): 1 g via INTRAVENOUS
  Filled 2022-02-08: qty 1
  Filled 2022-02-08 (×2): qty 20

## 2022-02-08 MED ORDER — ACETAMINOPHEN 325 MG PO TABS: 650.0000 mg | ORAL_TABLET | Freq: Four times a day (QID) | ORAL | Status: DC | PRN

## 2022-02-08 MED ORDER — ACETAMINOPHEN 650 MG RE SUPP: 650.0000 mg | Freq: Four times a day (QID) | RECTAL | Status: DC | PRN

## 2022-02-08 MED ORDER — PROCHLORPERAZINE EDISYLATE 10 MG/2ML IJ SOLN
10.0000 mg | Freq: Once | INTRAMUSCULAR | Status: AC
  Administered 2022-02-08: 10 mg via INTRAVENOUS
  Filled 2022-02-08: qty 2

## 2022-02-08 MED ORDER — SODIUM CHLORIDE 0.9 % IV BOLUS
1000.0000 mL | Freq: Once | INTRAVENOUS | Status: AC
  Administered 2022-02-08: 1000 mL via INTRAVENOUS

## 2022-02-08 NOTE — Assessment & Plan Note (Addendum)
Patient presenting with bilateral CVA tenderness and and CT demonstrating extensive perinephritic inflammation.  Presentation complicated by partially obstructing kidney stone.  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  Following admission, positive blood culture for ESBL E. coli.  Continue meropenem.  Urology took patient for cystoscopy and stent placement.    Patient tolerated procedure well.  Postop day 1, patient cleared.  Patient will follow-up with urology in 7 to 10 days.  Oxybutynin as needed and daily Flomax for stent tolerance.  Patient discharged on 9 more days of Augmentin to complete 10 days of therapy.  Blood cultures grew out ESBL E. coli which was sensitive to Augmentin.

## 2022-02-08 NOTE — Assessment & Plan Note (Addendum)
In the setting of poor p.o. intake due to acute illness, pyelonephritis and mild hydronephrosis.  Resolved with IV fluids.

## 2022-02-08 NOTE — Consult Note (Signed)
Urology Consult  Requesting physician: Jene Every, MD  Reason consultation: Distal ureteral calculus with UTI  Chief Complaint: Infection  History of Present Illness: Martha Welch is a 56 y.o. female with a history of recurrent UTI/pyelonephritis presented to the ED this morning with complaints of left flank pain, dysuria, urinary frequency.  She had subjective fever earlier this week.  In the ED she was afebrile with stable vital signs.  Leukocytosis at 18.3.  UA with large leukocytes; >50 WBC and 6-10 epis.  Lactate normal at 1.4  Stone protocol CT with a 4 mm left distal ureteral calculus with minimal hydronephrosis.  Bilateral perinephric stranding noted.  Prior CT 2018 with a 3 mm right distal ureteral calculus and multiple left ureteral calculi largest measuring 9 mm.  Denies previous intervention for urinary calculi.  A CT and 2021 showed right perinephric stranding and no hydronephrosis or calculi.  Prior history of recurrent pyelonephritis   Past Medical History:  Diagnosis Date   Anemia     Past Surgical History:  Procedure Laterality Date   ABDOMINAL HYSTERECTOMY     CESAREAN SECTION     CHOLECYSTECTOMY      Home Medications:  No outpatient medications have been marked as taking for the 02/08/22 encounter [facility] Encounter).    Allergies:  Allergies  Allergen Reactions   Amoxicillin Other (See Comments)    Yeast infection    No family history on file.  Social History:  reports that she has been smoking. She has been smoking an average of 1.5 packs per day. She has never used smokeless tobacco. She reports current alcohol use. She reports that she does not use drugs.  ROS: A complete review of systems was performed.  All systems are negative except for pertinent findings as noted.  Physical Exam:  Vital signs in last 24 hours: Temp:  [97.5 F (36.4 C)-98.9 F (37.2 C)] 98.9 F (37.2 C) (10/26 1557) Pulse Rate:  [86-100] 86 (10/26 1557) Resp:   [16-18] 18 (10/26 1557) BP: (128-158)/(64-96) 128/69 (10/26 1557) SpO2:  [92 %-100 %] 96 % (10/26 1557) Weight:  [59 kg] 59 kg (10/26 1024) Constitutional:  Alert and oriented, No acute distress HEENT: [place] AT, moist mucus membranes.  Trachea midline, no masses Cardiovascular: Regular rate and rhythm, no clubbing, cyanosis, or edema. Respiratory: Normal respiratory effort, lungs clear bilaterally GI: Abdomen is soft, nontender, nondistended, no abdominal masses GU: Left CVA tenderness Psychiatric: Normal mood and affect   Laboratory Data:  Recent Labs    02/08/22 1026  WBC 18.3*  HGB 15.3*  HCT 47.5*   Recent Labs    02/08/22 1026  NA 140  K 2.7*  CL 106  CO2 22  GLUCOSE 122*  BUN 20  CREATININE 1.06*  CALCIUM 8.7*    Radiologic Imaging: See the images were personally reviewed and interpreted  CT Renal Stone Study  Result Date: 02/08/2022 CLINICAL DATA:  Recurrent urinary tract infection, flank pain, low back pain EXAM: CT ABDOMEN AND PELVIS WITHOUT CONTRAST TECHNIQUE: Multidetector CT imaging of the abdomen and pelvis was performed following the standard protocol without IV contrast. RADIATION DOSE REDUCTION: This exam was performed according to the departmental dose-optimization program which includes automated exposure control, adjustment of the mA and/or kV according to patient size and/or use of iterative reconstruction technique. COMPARISON:  04/10/2020 FINDINGS: Lower chest: Mild left basilar atelectasis. Cardiac size within normal limits. Hepatobiliary: No focal liver abnormality is seen. Status post cholecystectomy. No biliary dilatation. Pancreas: Unremarkable  Spleen: Unremarkable Adrenals/Urinary Tract: The adrenal glands are unremarkable. The kidneys are normal in size and position. There is extensive bilateral perinephric stranding suggesting an underlying bilateral infectious or inflammatory nephritis. No loculated perinephric fluid collections. There is  superimposed minimal left hydronephrosis secondary to a minimally obstructing 4 mm calculus within the distal left ureter seen 1-2 cm proximal to the left ureterovesicular junction. Numerous additional nonobstructing calculi are noted within the mid and lower pole of the left kidney measuring up to 4 mm. No hydronephrosis on the right. No nephro or urolithiasis on the right. The bladder is unremarkable. Stomach/Bowel: Stomach is within normal limits. Appendix appears normal. No evidence of bowel wall thickening, distention, or inflammatory changes. Vascular/Lymphatic: Aortic atherosclerosis. No pathologically enlarged abdominal or pelvic lymph nodes. Shotty left pararenal adenopathy may be reactive in nature. Reproductive: Status post hysterectomy. No adnexal masses. Other: No abdominal wall hernia or abnormality. No abdominopelvic ascites. Musculoskeletal: Osseous structures are age-appropriate. No acute bone abnormality. No lytic or blastic bone lesion. IMPRESSION: 1. Minimally obstructing 4 mm calculus within the distal left ureter resulting in minimal left hydronephrosis. Superimposed mild left nonobstructing nephrolithiasis. 2. Extensive bilateral perinephric stranding suggesting an underlying infectious or inflammatory nephritis. No loculated perinephric fluid collections. Correlation with urinalysis and urine culture is recommended for further evaluation. 3. Aortic atherosclerosis. Aortic Atherosclerosis (ICD10-I70.0). Electronically Signed   By: Fidela Salisbury M.D.   On: 02/08/2022 12:12    Impression/Assessment:  56 y.o. female with a UTI and minimally obstructing left distal ureteral calculus.  She is afebrile with stable vital signs.  Lactate is normal.  She does have leukocytosis  Recommendation: With a minimally obstructing stone and no definite evidence of sepsis would not recommend urgent stent placement Continue IV antibiotics per hospitalist Reassess in a.m. and if improving would consider  left ureteroscopy with stone removal and stent placement based on the size and location of this calculus NPO after midnight   02/08/2022, 6:15 PM  John Giovanni,  MD

## 2022-02-08 NOTE — ED Notes (Signed)
Pt given cup of water, soda and a meal tray from ED fridge. Pt thankful. Pt understands will receive hot meal tray for dinner from dietary team.

## 2022-02-08 NOTE — H&P (View-Only) (Signed)
Urology Consult  Requesting physician: Jene Every, MD  Reason consultation: Distal ureteral calculus with UTI  Chief Complaint: Infection  History of Present Illness: Martha Welch is a 56 y.o. female with a history of recurrent UTI/pyelonephritis presented to the ED this morning with complaints of left flank pain, dysuria, urinary frequency.  She had subjective fever earlier this week.  In the ED she was afebrile with stable vital signs.  Leukocytosis at 18.3.  UA with large leukocytes; >50 WBC and 6-10 epis.  Lactate normal at 1.4  Stone protocol CT with a 4 mm left distal ureteral calculus with minimal hydronephrosis.  Bilateral perinephric stranding noted.  Prior CT 2018 with a 3 mm right distal ureteral calculus and multiple left ureteral calculi largest measuring 9 mm.  Denies previous intervention for urinary calculi.  A CT and 2021 showed right perinephric stranding and no hydronephrosis or calculi.  Prior history of recurrent pyelonephritis   Past Medical History:  Diagnosis Date   Anemia     Past Surgical History:  Procedure Laterality Date   ABDOMINAL HYSTERECTOMY     CESAREAN SECTION     CHOLECYSTECTOMY      Home Medications:  No outpatient medications have been marked as taking for the 02/08/22 encounter [facility] Encounter).    Allergies:  Allergies  Allergen Reactions   Amoxicillin Other (See Comments)    Yeast infection    No family history on file.  Social History:  reports that she has been smoking. She has been smoking an average of 1.5 packs per day. She has never used smokeless tobacco. She reports current alcohol use. She reports that she does not use drugs.  ROS: A complete review of systems was performed.  All systems are negative except for pertinent findings as noted.  Physical Exam:  Vital signs in last 24 hours: Temp:  [97.5 F (36.4 C)-98.9 F (37.2 C)] 98.9 F (37.2 C) (10/26 1557) Pulse Rate:  [86-100] 86 (10/26 1557) Resp:   [16-18] 18 (10/26 1557) BP: (128-158)/(64-96) 128/69 (10/26 1557) SpO2:  [92 %-100 %] 96 % (10/26 1557) Weight:  [59 kg] 59 kg (10/26 1024) Constitutional:  Alert and oriented, No acute distress HEENT: [place] AT, moist mucus membranes.  Trachea midline, no masses Cardiovascular: Regular rate and rhythm, no clubbing, cyanosis, or edema. Respiratory: Normal respiratory effort, lungs clear bilaterally GI: Abdomen is soft, nontender, nondistended, no abdominal masses GU: Left CVA tenderness Psychiatric: Normal mood and affect   Laboratory Data:  Recent Labs    02/08/22 1026  WBC 18.3*  HGB 15.3*  HCT 47.5*   Recent Labs    02/08/22 1026  NA 140  K 2.7*  CL 106  CO2 22  GLUCOSE 122*  BUN 20  CREATININE 1.06*  CALCIUM 8.7*    Radiologic Imaging: See the images were personally reviewed and interpreted  CT Renal Stone Study  Result Date: 02/08/2022 CLINICAL DATA:  Recurrent urinary tract infection, flank pain, low back pain EXAM: CT ABDOMEN AND PELVIS WITHOUT CONTRAST TECHNIQUE: Multidetector CT imaging of the abdomen and pelvis was performed following the standard protocol without IV contrast. RADIATION DOSE REDUCTION: This exam was performed according to the departmental dose-optimization program which includes automated exposure control, adjustment of the mA and/or kV according to patient size and/or use of iterative reconstruction technique. COMPARISON:  04/10/2020 FINDINGS: Lower chest: Mild left basilar atelectasis. Cardiac size within normal limits. Hepatobiliary: No focal liver abnormality is seen. Status post cholecystectomy. No biliary dilatation. Pancreas: Unremarkable  Spleen: Unremarkable Adrenals/Urinary Tract: The adrenal glands are unremarkable. The kidneys are normal in size and position. There is extensive bilateral perinephric stranding suggesting an underlying bilateral infectious or inflammatory nephritis. No loculated perinephric fluid collections. There is  superimposed minimal left hydronephrosis secondary to a minimally obstructing 4 mm calculus within the distal left ureter seen 1-2 cm proximal to the left ureterovesicular junction. Numerous additional nonobstructing calculi are noted within the mid and lower pole of the left kidney measuring up to 4 mm. No hydronephrosis on the right. No nephro or urolithiasis on the right. The bladder is unremarkable. Stomach/Bowel: Stomach is within normal limits. Appendix appears normal. No evidence of bowel wall thickening, distention, or inflammatory changes. Vascular/Lymphatic: Aortic atherosclerosis. No pathologically enlarged abdominal or pelvic lymph nodes. Shotty left pararenal adenopathy may be reactive in nature. Reproductive: Status post hysterectomy. No adnexal masses. Other: No abdominal wall hernia or abnormality. No abdominopelvic ascites. Musculoskeletal: Osseous structures are age-appropriate. No acute bone abnormality. No lytic or blastic bone lesion. IMPRESSION: 1. Minimally obstructing 4 mm calculus within the distal left ureter resulting in minimal left hydronephrosis. Superimposed mild left nonobstructing nephrolithiasis. 2. Extensive bilateral perinephric stranding suggesting an underlying infectious or inflammatory nephritis. No loculated perinephric fluid collections. Correlation with urinalysis and urine culture is recommended for further evaluation. 3. Aortic atherosclerosis. Aortic Atherosclerosis (ICD10-I70.0). Electronically Signed   By: Fidela Salisbury M.D.   On: 02/08/2022 12:12    Impression/Assessment:  56 y.o. female with a UTI and minimally obstructing left distal ureteral calculus.  She is afebrile with stable vital signs.  Lactate is normal.  She does have leukocytosis  Recommendation: With a minimally obstructing stone and no definite evidence of sepsis would not recommend urgent stent placement Continue IV antibiotics per hospitalist Reassess in a.m. and if improving would consider  left ureteroscopy with stone removal and stent placement based on the size and location of this calculus NPO after midnight   02/08/2022, 6:15 PM  John Giovanni,  MD

## 2022-02-08 NOTE — ED Notes (Signed)
Pt NPO and aware of status as EDP Kinner notified her in person; pt placed on monitor; stretcher locked low; rail up; call bell within reach; given warm blanket; pt has personal phone.

## 2022-02-08 NOTE — H&P (Signed)
History and Physical    Patient: Martha Welch GYI:[id number] DOB: 1966-04-15 DOA: 02/08/2022 DOS: the patient was seen and examined on 02/08/2022 PCP: Pcp, No  Patient coming from: Home  Chief Complaint:  Chief Complaint  Patient presents with   Urinary Frequency   HPI: Martha Welch is a 56 y.o. female with medical history significant of nephrolithiasis, who presents to the ED with complaints of back pain and urinary frequency.  Mrs. Bahena states she had an episode of hematuria approximately 3 weeks ago.  Given her history of kidney stones, she did not seek care at that time.  Herr symptoms self resolved.  Then approximately 4 days ago, she developed severe bilateral back pain, L > R.  At the same time, she developed urinary frequency, fever and chills.  She denies any hematuria or dysuria at this time. She denies any other complaints at this time including headache, vision changes, shortness of breath, chest pain, palpitations, abdominal pain, nausea, vomiting, diarrhea.  Mrs. [facility] notes a history of recurrent kidney stones dating back to approximately 2006 after her hysterectomy.  As far she knows, stone analysis has not been performed.  ED course On arrival to the ED, patient was afebrile at 97.5 with blood pressure of 129/64 and heart rate of 90.  Initial work-up remarkable for white blood count of 18.3, hemoglobin of 15.3, potassium of 2.7 and creatinine of 1.06.  Urinalysis was obtained that showed moderate hemoglobin, large leukocytes, positive nitrites and many bacteria.CT renal stone study obtained that demonstrated a minimally obstructing 4 mm calculus in the distal left ureter with minimal left hydronephrosis.  In addition, there is extensive bilateral perinephritic stranding.  Urology consulted given obstructing stone.  [facility] contacted for for admission.  Review of Systems: As mentioned in the history of present illness. All other systems reviewed and are negative.  Past  Medical History:  Diagnosis Date   Anemia    Past Surgical History:  Procedure Laterality Date   ABDOMINAL HYSTERECTOMY     CESAREAN SECTION     CHOLECYSTECTOMY     Social History:  reports that she has been smoking. She has been smoking an average of 1.5 packs per day. She has never used smokeless tobacco. She reports current alcohol use. She reports that she does not use drugs.  Allergies  Allergen Reactions   Amoxicillin Other (See Comments)    Yeast infection    No family history on file.  Prior to Admission medications   Not on File    Physical Exam: Vitals:   02/08/22 1336 02/08/22 1400 02/08/22 1430 02/08/22 1433  BP:  137/70  134/68  Pulse:  90 87 88  Resp:    16  Temp:    98 F (36.7 C)  TempSrc:    Oral  SpO2: 100%  94% 100%  Weight:      Height:       Physical Exam Vitals and nursing note reviewed.  Constitutional:      General: She is not in acute distress.    Appearance: She is normal weight.  HENT:     Head: Normocephalic and atraumatic.  Eyes:     Conjunctiva/sclera: Conjunctivae normal.     Pupils: Pupils are equal, round, and reactive to light.  Cardiovascular:     Rate and Rhythm: Normal rate and regular rhythm.     Heart sounds: No murmur heard.    No gallop.  Pulmonary:     Effort: Pulmonary effort is normal.  No respiratory distress.     Breath sounds: Normal breath sounds. No wheezing or rales.  Abdominal:     General: Bowel sounds are normal. There is no distension.     Palpations: Abdomen is soft.     Tenderness: There is no abdominal tenderness. There is right CVA tenderness and left CVA tenderness (Left CVA tenderness more severe than right). There is no guarding.  Musculoskeletal:     Right lower leg: No edema.     Left lower leg: No edema.  Skin:    General: Skin is warm and dry.     Comments: Bruise present on right knee, with no overlying tenderness.  Neurological:     General: No focal deficit present.     Mental Status:  She is alert and oriented to person, place, and time. Mental status is at baseline.  Psychiatric:        Mood and Affect: Mood normal.        Behavior: Behavior normal.    Data Reviewed: CBC remarkable for WBC elevation at 18.3, hemoglobin of 15.3.  BMP remarkable for potassium of 2.7, glucose of 122, creatinine of 1.06, calcium of 8.7.  Urinalysis demonstrates moderate hemoglobin, with ketonuria, proteinuria, positive nitrites and large leukocytes.  Evidence of pyuria with many bacteria.  Lactic acid within normal limits at 1.4  CT renal study with minimally obstructing 4 mm calculus within the distal left ureter with mild left hydronephrosis.  In addition there is extensive bilateral perinephritic stranding suggestive of underlying infection or inflammatory nephritis.  No loculated fluid collections.  EKG personally reviewed.  Consistent with sinus rhythm with a rate of 82.  Left axis deviation.  No acute ST or T wave changes to suggest ischemia.  Results are pending, will review when available.  Assessment and Plan: * Acute pyelonephritis Patient presenting with bilateral CVA tenderness and and CT demonstrating extensive perinephritic inflammation.  Presentation complicated by partially obstructing kidney stone.  Previous history of UTI with evidence of ESBL.  - Urology consulted for management of nephrolithiasis as noted below - Meropenem per pharmacy dosing - Norco for pain control - Urine culture pending - Blood cultures pending  Nephrolithiasis Patient has a history of recurrent nephrolithiasis.  No previous stone analysis within chart however urinalysis in 2017 demonstrated calcium oxalate.  - Urology consulted; appreciate their recommendations - Per discussion with Dr. Bernardo Heater, potential stent placement on 10/27 - Given recurrent nature, patient may benefit from a thiazide in the future if dietary changes are not adequate to prevent stones  AKI (acute kidney injury) ([place]) In  the setting of poor p.o. intake due to acute illness, pyelonephritis and mild hydronephrosis.  - IV fluid resuscitation - Repeat BMP in a.m.  Hypokalemia - S/p K-Dur 40 mEq x 2 - Repeat BMP in the a.m.  Hyperglycemia Hyperglycemic on presentation likely in the setting of acute illness, however prior history of hyperglycemia.  - A1c pending - No indication for SSI at this time   Advance Care Planning:   Code Status: Full Code   Consults: Urology, discussed with Dr. Bernardo Heater  Family Communication: No family at bedside  Severity of Illness: The appropriate patient status for this patient is OBSERVATION. Observation status is judged to be reasonable and necessary in order to provide the required intensity of service to ensure the patient's safety. The patient's presenting symptoms, physical exam findings, and initial radiographic and laboratory data in the context of their medical condition is felt to place them at  decreased risk for further clinical deterioration. Furthermore, it is anticipated that the patient will be medically stable for discharge from the hospital within 2 midnights of admission.   Author: Jose Persia, MD 02/08/2022 2:48 PM  For on call review [email or web address].

## 2022-02-08 NOTE — Assessment & Plan Note (Addendum)
Hyperglycemic on presentation likely in the setting of acute illness, however prior history of hyperglycemia.  A1c at 5.8, prediabetic

## 2022-02-08 NOTE — ED Notes (Signed)
Informed RN bed assigned 

## 2022-02-08 NOTE — ED Provider Notes (Addendum)
[facility] Provider Note    Event Date/Time   First MD Initiated Contact with Patient 02/08/22 1043     (approximate)   History   Urinary Frequency   HPI  Martha Welch is a 56 y.o. female with history of anemia, frequent UTIs, kidney stone presents emergency department with urinary frequency and burning, patient states she has had fever along with nausea and vomiting.  Also states she is unsure if she ever passed a 9 mm kidney stone.  Complaining of bilateral ear pain.  Some body aches and muscle aches.      Physical Exam   Triage Vital Signs: ED Triage Vitals [02/08/22 1024]  Enc Vitals Group     BP 129/64     Pulse Rate 90     Resp 18     Temp (!) 97.5 F (36.4 C)     Temp Source Oral     SpO2 100 %     Weight 130 lb (59 kg)     Height 5\' 3"  (1.6 m)     Head Circumference      Peak Flow      Pain Score 3     Pain Loc      Pain Edu?      Excl. in GC?     Most recent vital signs: Vitals:   02/08/22 1024  BP: 129/64  Pulse: 90  Resp: 18  Temp: (!) 97.5 F (36.4 C)  SpO2: 100%     General: Awake, no distress.   CV:  Good peripheral perfusion. regular rate and  rhythm Resp:  Normal effort. Lungs CTA Abd:  No distention.  Nontender Other:      ED Results / Procedures / Treatments   Labs (all labs ordered are listed, but only abnormal results are displayed) Labs Reviewed  URINALYSIS, ROUTINE W REFLEX MICROSCOPIC - Abnormal; Notable for the following components:      Result Value   Color, Urine YELLOW (*)    APPearance CLOUDY (*)    Hgb urine dipstick MODERATE (*)    Ketones, ur 5 (*)    Protein, ur 100 (*)    Nitrite POSITIVE (*)    Leukocytes,Ua LARGE (*)    WBC, UA >50 (*)    Bacteria, UA MANY (*)    All other components within normal limits  BASIC METABOLIC PANEL - Abnormal; Notable for the following components:   Potassium 2.7 (*)    Glucose, Bld 122 (*)    Creatinine, Ser 1.06 (*)    Calcium 8.7 (*)     All other components within normal limits  CBC - Abnormal; Notable for the following components:   WBC 18.3 (*)    RBC 5.36 (*)    Hemoglobin 15.3 (*)    HCT 47.5 (*)    All other components within normal limits  CULTURE, BLOOD (ROUTINE X 2)  CULTURE, BLOOD (ROUTINE X 2)  URINE CULTURE  LACTIC ACID, PLASMA  LACTIC ACID, PLASMA     EKG  EKG   RADIOLOGY CT renal stone    PROCEDURES:   .Critical Care E&M  Performed by: 02/10/22, PA-C Critical care provider statement:    Critical care time (minutes):  30   Critical care time was exclusive of:  Separately billable procedures and treating other patients   Critical care was necessary to treat or prevent imminent or life-threatening deterioration of the following conditions:  Renal failure and sepsis   Critical  care was time spent personally by me on the following activities:  Development of treatment plan with patient or surrogate, evaluation of patient's response to treatment, examination of patient, obtaining history from patient or surrogate, ordering and performing treatments and interventions, ordering and review of laboratory studies, ordering and review of radiographic studies, review of old charts and re-evaluation of patient's condition   Care discussed with: admitting provider   After initial E/M assessment, critical care services were subsequently performed that were exclusive of separately billable procedures or treatment.      MEDICATIONS ORDERED IN ED: Medications  cefTRIAXone (ROCEPHIN) 2 g in sodium chloride 0.9 % 100 mL IVPB (has no administration in time range)  sodium chloride 0.9 % bolus 1,000 mL (0 mLs Intravenous Stopped 02/08/22 1259)  potassium chloride SA (KLOR-CON M) CR tablet 40 mEq (40 mEq Oral Given 02/08/22 1151)  ondansetron (ZOFRAN) injection 4 mg (4 mg Intravenous Given 02/08/22 1151)     IMPRESSION / MDM / ASSESSMENT AND PLAN / ED COURSE  I reviewed the triage vital signs and the  nursing notes.                              Differential diagnosis includes, but is not limited to, pyelonephritis, UTI, infected kidney stone  Patient's presentation is most consistent with acute presentation with potential threat to life or bodily function.   Patient with critical value on potassium at 2.7, therefore ordered EKG to assess whether she has an irregular rhythm.  CBC is concerning for infection with elevated WBC of 18.3, urinalysis shows large amount of infection with positive nitrites large amount of leuks greater than 50 WBCs many bacteria white blood cell clumps and hyaline casts.  Due to the patient's kidney stones we will also do CT renal stone to assess for pyelo versus infected kidney stone.  Due to the critical value on the potassium she was given K. Dur 40 mEq p.o.   CT renal stone study shows a 4 mm nonobstructing stone with perinephric stranding indicating infection.  Dr. Corky Downs in to see the patient for admission.  Started Rocephin 2 g IV.  Dr. Corky Downs will also call the hospitalist   FINAL CLINICAL IMPRESSION(S) / ED DIAGNOSES   Final diagnoses:  Acute pyelonephritis  Kidney stone  Hypokalemia     Rx / DC Orders   ED Discharge Orders     None        Note:  This document was prepared using Dragon voice recognition software and may include unintentional dictation errors.    Versie Starks, PA-C 02/08/22 1309    Martha Drafts, MD 02/08/22 1325    Versie Starks, PA-C 02/08/22 1328    Martha Drafts, MD 02/08/22 1453

## 2022-02-08 NOTE — ED Triage Notes (Signed)
Patient to ED for urinary frequency. Patient states she gets UTI's frequently. Symptoms started Monday. Patient also c/o pain in both ears and lower back pain.

## 2022-02-08 NOTE — Consult Note (Signed)
Pharmacy Antibiotic Note  Martha Welch is a 56 y.o. female admitted on 02/08/2022 with  pyelonephritis with hx of ESBL infection .  Pharmacy has been consulted for meropenem dosing.   Plan: Meropenem 1gm q 12 hrs  Height: 5\' 3"  (160 cm) Weight: 59 kg (130 lb) IBW/kg (Calculated) : 52.4  Temp (24hrs), Avg:97.8 F (36.6 C), Min:97.5 F (36.4 C), Max:98 F (36.7 C)  Recent Labs  Lab 02/08/22 1026 02/08/22 1327  WBC 18.3*  --   CREATININE 1.06*  --   LATICACIDVEN  --  1.4    Estimated Creatinine Clearance: 49 mL/min (A) (by C-G formula based on SCr of 1.06 mg/dL (H)).    Allergies  Allergen Reactions   Amoxicillin Other (See Comments)    Yeast infection    Antimicrobials this admission: Ceftriaxone 10/26 >> 10/26 Meropenem 10/26 >>   Dose adjustments this admission:n/a  Microbiology results: 10/26 BCx: in process 10/26 UCx: in process   Thank you for allowing pharmacy to be a part of this patient's care.  Tasnia Spegal Rodriguez-Guzman PharmD, BCPS 02/08/2022 2:42 PM

## 2022-02-08 NOTE — ED Provider Notes (Signed)
Consulted with Dr. Bernardo Heater of urology, he recommends admission to medicine, he will consult.   Lavonia Drafts, MD 02/08/22 [phone]

## 2022-02-08 NOTE — Progress Notes (Addendum)
Pt still complaining of nausea after zofran. NP Randol Kern made aware. Will continue to monitor.  Update 2017: NP Randol Kern placed order. Will continue to monitor.

## 2022-02-08 NOTE — Assessment & Plan Note (Addendum)
Improved, still low.  Replacing.

## 2022-02-08 NOTE — Assessment & Plan Note (Addendum)
Patient has a history of recurrent nephrolithiasis.  No previous stone analysis within chart however urinalysis in 2017 demonstrated calcium oxalate.  Status post stent placement.  Given recurrent nature, patient may benefit from a thiazide in the future if dietary changes are not adequate to prevent stones

## 2022-02-09 ENCOUNTER — Observation Stay: Payer: Self-pay | Admitting: Anesthesiology

## 2022-02-09 ENCOUNTER — Encounter
Admission: EM | Disposition: A | Discharge status: Home / Self Care | Payer: Self-pay | Source: Home / Self Care | Attending: Internal Medicine

## 2022-02-09 ENCOUNTER — Encounter: Payer: Self-pay | Admitting: Internal Medicine

## 2022-02-09 ENCOUNTER — Observation Stay: Payer: Self-pay

## 2022-02-09 DIAGNOSIS — N12 Tubulo-interstitial nephritis, not specified as acute or chronic: Secondary | ICD-10-CM | POA: Diagnosis present

## 2022-02-09 DIAGNOSIS — B9620 Unspecified Escherichia coli [E. coli] as the cause of diseases classified elsewhere: Secondary | ICD-10-CM

## 2022-02-09 DIAGNOSIS — R7881 Bacteremia: Secondary | ICD-10-CM

## 2022-02-09 DIAGNOSIS — R7303 Prediabetes: Secondary | ICD-10-CM

## 2022-02-09 HISTORY — PX: CYSTOSCOPY/URETEROSCOPY/HOLMIUM LASER/STENT PLACEMENT: SHX6546

## 2022-02-09 HISTORY — PX: CYSTOSCOPY WITH STENT PLACEMENT: SHX5790

## 2022-02-09 LAB — BLOOD CULTURE ID PANEL (REFLEXED) - BCID2

## 2022-02-09 LAB — CBC
HCT: 41.2 % (ref 36.0–46.0)
Hemoglobin: 13.4 g/dL (ref 12.0–15.0)
MCH: 29.2 pg (ref 26.0–34.0)
MCHC: 32.5 g/dL (ref 30.0–36.0)
MCV: 89.8 fL (ref 80.0–100.0)
Platelets: 180 10*3/uL (ref 150–400)
RBC: 4.59 MIL/uL (ref 3.87–5.11)
RDW: 14.6 % (ref 11.5–15.5)
WBC: 13.7 10*3/uL — ABNORMAL HIGH (ref 4.0–10.5)
nRBC: 0 % (ref 0.0–0.2)

## 2022-02-09 LAB — BASIC METABOLIC PANEL
Anion gap: 5 (ref 5–15)
BUN: 15 mg/dL (ref 6–20)
CO2: 21 mmol/L — ABNORMAL LOW (ref 22–32)
Calcium: 8.1 mg/dL — ABNORMAL LOW (ref 8.9–10.3)
Chloride: 113 mmol/L — ABNORMAL HIGH (ref 98–111)
Creatinine, Ser: 0.87 mg/dL (ref 0.44–1.00)
GFR, Estimated: >60 mL/min (ref >60)
Glucose, Bld: 110 mg/dL — ABNORMAL HIGH (ref 70–99)
Potassium: 3.2 mmol/L — ABNORMAL LOW (ref 3.5–5.1)
Sodium: 139 mmol/L (ref 135–145)

## 2022-02-09 LAB — HEMOGLOBIN A1C
Hgb A1c MFr Bld: 5.8 % — ABNORMAL HIGH (ref 4.8–5.6)
Mean Plasma Glucose: 119.76 mg/dL

## 2022-02-09 LAB — HIV ANTIBODY (ROUTINE TESTING W REFLEX): HIV Screen 4th Generation wRfx: NONREACTIVE

## 2022-02-09 SURGERY — CYSTOSCOPY, WITH STENT INSERTION
Anesthesia: General | Site: Ureter | Laterality: Left

## 2022-02-09 MED ORDER — CHLORHEXIDINE GLUCONATE 0.12 % MT SOLN
OROMUCOSAL | Status: AC
  Administered 2022-02-09: 15 mL via OROMUCOSAL
  Filled 2022-02-09: qty 15

## 2022-02-09 MED ORDER — FENTANYL CITRATE (PF) 100 MCG/2ML IJ SOLN: 25.0000 ug | INTRAMUSCULAR | Status: DC | PRN

## 2022-02-09 MED ORDER — LIDOCAINE HCL (PF) 2 % IJ SOLN
INTRAMUSCULAR | Status: AC
  Filled 2022-02-09: qty 5

## 2022-02-09 MED ORDER — LIDOCAINE HCL (PF) 2 % IJ SOLN
INTRAMUSCULAR | Status: DC | PRN
  Administered 2022-02-09: 50 mg

## 2022-02-09 MED ORDER — SODIUM CHLORIDE 0.9 % IR SOLN
Status: DC | PRN
  Administered 2022-02-09: 1000 mL

## 2022-02-09 MED ORDER — SODIUM CHLORIDE 0.9 % IV SOLN: INTRAVENOUS | Status: DC | PRN

## 2022-02-09 MED ORDER — CHLORHEXIDINE GLUCONATE 0.12 % MT SOLN: 15.0000 mL | Freq: Once | OROMUCOSAL | Status: AC

## 2022-02-09 MED ORDER — ONDANSETRON HCL 4 MG/2ML IJ SOLN
INTRAMUSCULAR | Status: AC
  Filled 2022-02-09: qty 2

## 2022-02-09 MED ORDER — MIDAZOLAM HCL 2 MG/2ML IJ SOLN
INTRAMUSCULAR | Status: AC
  Filled 2022-02-09: qty 2

## 2022-02-09 MED ORDER — DEXAMETHASONE SODIUM PHOSPHATE 10 MG/ML IJ SOLN
INTRAMUSCULAR | Status: AC
  Filled 2022-02-09: qty 1

## 2022-02-09 MED ORDER — MORPHINE SULFATE (PF) 2 MG/ML IV SOLN
1.0000 mg | INTRAVENOUS | Status: DC | PRN
  Administered 2022-02-09: 1 mg via INTRAVENOUS
  Filled 2022-02-09: qty 1

## 2022-02-09 MED ORDER — DEXAMETHASONE SODIUM PHOSPHATE 10 MG/ML IJ SOLN
INTRAMUSCULAR | Status: DC | PRN
  Administered 2022-02-09: 10 mg via INTRAVENOUS

## 2022-02-09 MED ORDER — LACTATED RINGERS IV SOLN: INTRAVENOUS | Status: DC

## 2022-02-09 MED ORDER — ORAL CARE MOUTH RINSE: 15.0000 mL | Freq: Once | OROMUCOSAL | Status: AC

## 2022-02-09 MED ORDER — PROPOFOL 10 MG/ML IV BOLUS
INTRAVENOUS | Status: AC
  Filled 2022-02-09: qty 20

## 2022-02-09 MED ORDER — IOHEXOL 180 MG/ML  SOLN
INTRAMUSCULAR | Status: DC | PRN
  Administered 2022-02-09: 10 mL

## 2022-02-09 MED ORDER — GLYCOPYRROLATE 0.2 MG/ML IJ SOLN
INTRAMUSCULAR | Status: AC
  Filled 2022-02-09: qty 1

## 2022-02-09 MED ORDER — TAMSULOSIN HCL 0.4 MG PO CAPS
0.4000 mg | ORAL_CAPSULE | Freq: Every day | ORAL | Status: DC
  Administered 2022-02-09 – 2022-02-10 (×2): 0.4 mg via ORAL
  Filled 2022-02-09 (×2): qty 1

## 2022-02-09 MED ORDER — PROPOFOL 10 MG/ML IV BOLUS
INTRAVENOUS | Status: DC | PRN
  Administered 2022-02-09: 100 mg via INTRAVENOUS

## 2022-02-09 MED ORDER — MIDAZOLAM HCL 5 MG/5ML IJ SOLN
INTRAMUSCULAR | Status: DC | PRN
  Administered 2022-02-09: 2 mg via INTRAVENOUS

## 2022-02-09 MED ORDER — FENTANYL CITRATE (PF) 100 MCG/2ML IJ SOLN
INTRAMUSCULAR | Status: DC | PRN
  Administered 2022-02-09: 50 ug via INTRAVENOUS
  Administered 2022-02-09 (×2): 25 ug via INTRAVENOUS

## 2022-02-09 MED ORDER — FENTANYL CITRATE (PF) 100 MCG/2ML IJ SOLN
INTRAMUSCULAR | Status: AC
  Filled 2022-02-09: qty 2

## 2022-02-09 MED ORDER — ONDANSETRON HCL 4 MG/2ML IJ SOLN
INTRAMUSCULAR | Status: DC | PRN
  Administered 2022-02-09: 4 mg via INTRAVENOUS

## 2022-02-09 MED ORDER — GLYCOPYRROLATE 0.2 MG/ML IJ SOLN
INTRAMUSCULAR | Status: DC | PRN
  Administered 2022-02-09: .2 mg via INTRAVENOUS

## 2022-02-09 MED ORDER — SODIUM CHLORIDE 0.9 % IV SOLN
1.0000 g | Freq: Three times a day (TID) | INTRAVENOUS | Status: DC
  Administered 2022-02-09 – 2022-02-10 (×4): 1 g via INTRAVENOUS
  Filled 2022-02-09: qty 20
  Filled 2022-02-09 (×2): qty 1
  Filled 2022-02-09: qty 20
  Filled 2022-02-09 (×2): qty 1

## 2022-02-09 MED ORDER — OXYBUTYNIN CHLORIDE 5 MG PO TABS: 5.0000 mg | ORAL_TABLET | Freq: Three times a day (TID) | ORAL | Status: DC | PRN

## 2022-02-09 MED ORDER — ONDANSETRON HCL 4 MG/2ML IJ SOLN: 4.0000 mg | Freq: Once | INTRAMUSCULAR | Status: DC | PRN

## 2022-02-09 SURGICAL SUPPLY — 37 items
BAG DRAIN SIEMENS DORNER NS (MISCELLANEOUS) ×2 IMPLANT
BAG DRN NS LF (MISCELLANEOUS) ×2
BASKET LASER NITINOL 1.9FR (BASKET) IMPLANT
BASKET ZERO TIP 1.9FR (BASKET) IMPLANT
BRUSH SCRUB EZ 1% IODOPHOR (MISCELLANEOUS) ×2 IMPLANT
BSKT STON RTRVL 120 1.9FR (BASKET) ×2
BSKT STON RTRVL ZERO TP 1.9FR (BASKET)
CATH URET FLEX-TIP 2 LUMEN 10F (CATHETERS) IMPLANT
CATH URETL OPEN 5X70 (CATHETERS) IMPLANT
CATH URETL OPEN END 6X70 (CATHETERS) IMPLANT
CNTNR SPEC 2.5X3XGRAD LEK (MISCELLANEOUS) ×4
CONT SPEC 4OZ STER OR WHT (MISCELLANEOUS) ×4
CONT SPEC 4OZ STRL OR WHT (MISCELLANEOUS) ×4
CONTAINER SPEC 2.5X3XGRAD LEK (MISCELLANEOUS) IMPLANT
DRAPE UTILITY 15X26 TOWEL STRL (DRAPES) ×2 IMPLANT
FIBER LASER MOSES 200 DFL (Laser) IMPLANT
GAUZE 4X4 16PLY ~~LOC~~+RFID DBL (SPONGE) ×4 IMPLANT
GLOVE SURG UNDER POLY LF SZ7.5 (GLOVE) ×2 IMPLANT
GOWN STRL REUS W/ TWL LRG LVL3 (GOWN DISPOSABLE) ×2 IMPLANT
GOWN STRL REUS W/ TWL XL LVL3 (GOWN DISPOSABLE) ×2 IMPLANT
GOWN STRL REUS W/TWL LRG LVL3 (GOWN DISPOSABLE) ×2
GOWN STRL REUS W/TWL XL LVL3 (GOWN DISPOSABLE) ×2
GUIDEWIRE STR DUAL SENSOR (WIRE) ×2 IMPLANT
IV NS IRRIG 3000ML ARTHROMATIC (IV SOLUTION) ×2 IMPLANT
KIT TURNOVER CYSTO (KITS) ×2 IMPLANT
MANIFOLD NEPTUNE II (INSTRUMENTS) ×2 IMPLANT
PACK CYSTO AR (MISCELLANEOUS) ×2 IMPLANT
SET CYSTO W/LG BORE CLAMP LF (SET/KITS/TRAYS/PACK) ×2 IMPLANT
SHEATH NAVIGATOR HD 12/14X36 (SHEATH) IMPLANT
STENT URET 6FRX22 CONTOUR (STENTS) IMPLANT
STENT URET 6FRX24 CONTOUR (STENTS) IMPLANT
STENT URET 6FRX26 CONTOUR (STENTS) IMPLANT
SURGILUBE 2OZ TUBE FLIPTOP (MISCELLANEOUS) ×2 IMPLANT
TRAP FLUID SMOKE EVACUATOR (MISCELLANEOUS) ×2 IMPLANT
VALVE UROSEAL ADJ ENDO (VALVE) IMPLANT
WATER STERILE IRR 1000ML POUR (IV SOLUTION) ×2 IMPLANT
WATER STERILE IRR 500ML POUR (IV SOLUTION) ×2 IMPLANT

## 2022-02-09 NOTE — TOC Initial Note (Signed)
Transition of Care [facility]) - Initial/Assessment Note    Patient Details  Name: Martha Welch MRN: [id number] Date of Birth: 1965-10-03  [facility]) CM/SW Contact:    Beverly Sessions, RN Phone Number: 02/09/2022, 2:53 PM  Clinical Narrative:                    Patient does not have PCP listed Patient resides in [facility], and would not be eligible for [facility]   Provided information for [facility] on [facility] Please consult TOC for medication needs once determined what medications will be needed for discharge      Patient Goals and CMS Choice        Expected Discharge Plan and Services                                                Prior Living Arrangements/Services                       Activities of Daily Living Home Assistive Devices/Equipment: None ADL Screening (condition at time of admission) Patient's cognitive ability adequate to safely complete daily activities?: Yes Is the patient deaf or have difficulty hearing?: No Does the patient have difficulty seeing, even when wearing glasses/contacts?: No Does the patient have difficulty concentrating, remembering, or making decisions?: No Patient able to express need for assistance with ADLs?: No Does the patient have difficulty dressing or bathing?: No Independently performs ADLs?: Yes (appropriate for developmental age) Does the patient have difficulty walking or climbing stairs?: No Weakness of Legs: None Weakness of Arms/Hands: None  Permission Sought/Granted                  Emotional Assessment              Admission diagnosis:  Hypokalemia [E87.6] Kidney stone [N20.0] Acute pyelonephritis [N10] Patient Active Problem List   Diagnosis Date Noted   Acute pyelonephritis 02/08/2022   Kidney stone 02/08/2022   AKI (acute kidney injury) ([place]) 02/08/2022   Hypokalemia 02/08/2022   Hyperglycemia 02/08/2022   PCP:  Merryl Hacker  No Pharmacy:   [facility] [address], [place] [address] Phone: [phone] Fax: [phone]     Social Determinants of Health (SDOH) Interventions    Readmission Risk Interventions     No data to display

## 2022-02-09 NOTE — Plan of Care (Signed)

## 2022-02-09 NOTE — Progress Notes (Signed)
Urology Inpatient Progress Note  Subjective: No acute events overnight.  She is afebrile, VSS. Cultures growing ESBL E. coli.  WBC count down today, 13.7.  Creatinine down today, 0.87.  On antibiotics as below. Reports feeling slightly better today but is using a cool washcloth for comfort because she reports she feels hot.  She attributes this to the room temperature.  She also reports some dry mouth.  She has been n.p.o. since midnight.  Anti-infectives: Anti-infectives (From admission, onward)    Start     Dose/Rate Route Frequency Ordered Stop   02/09/22 1400  meropenem (MERREM) 1 g in sodium chloride 0.9 % 100 mL IVPB        1 g 200 mL/hr over 30 Minutes Intravenous Every 8 hours 02/09/22 0816     02/08/22 1445  meropenem (MERREM) 1 g in sodium chloride 0.9 % 100 mL IVPB  Status:  Discontinued        1 g 200 mL/hr over 30 Minutes Intravenous Every 12 hours 02/08/22 1436 02/09/22 0816   02/08/22 1300  cefTRIAXone (ROCEPHIN) 1 g in sodium chloride 0.9 % 100 mL IVPB  Status:  Discontinued        1 g 200 mL/hr over 30 Minutes Intravenous  Once 02/08/22 1248 02/08/22 1259   02/08/22 1300  cefTRIAXone (ROCEPHIN) 2 g in sodium chloride 0.9 % 100 mL IVPB        2 g 200 mL/hr over 30 Minutes Intravenous  Once 02/08/22 1259 02/08/22 1354       Current Facility-Administered Medications  Medication Dose Route Frequency Provider Last Rate Last Admin   acetaminophen (TYLENOL) tablet 650 mg  650 mg Oral Q6H PRN Jose Persia, MD       Or   acetaminophen (TYLENOL) suppository 650 mg  650 mg Rectal Q6H PRN Jose Persia, MD       HYDROcodone-acetaminophen (NORCO/VICODIN) 5-325 MG per tablet 1 tablet  1 tablet Oral Q6H PRN Jose Persia, MD   1 tablet at 02/08/22 2056   meropenem (MERREM) 1 g in sodium chloride 0.9 % 100 mL IVPB  1 g Intravenous Q8H Chinita Greenland A, RPH       nicotine (NICODERM CQ - dosed in mg/24 hours) patch 21 mg  21 mg Transdermal Daily PRN Jose Persia, MD        ondansetron (ZOFRAN) tablet 4 mg  4 mg Oral Q6H PRN Jose Persia, MD   4 mg at 02/08/22 1850   Or   ondansetron (ZOFRAN) injection 4 mg  4 mg Intravenous Q6H PRN Jose Persia, MD       polyethylene glycol (MIRALAX / GLYCOLAX) packet 17 g  17 g Oral Daily PRN Jose Persia, MD       Objective: Vital signs in last 24 hours: Temp:  [97.5 F (36.4 C)-99.3 F (37.4 C)] 99.1 F (37.3 C) (10/27 0759) Pulse Rate:  [85-100] 90 (10/27 0759) Resp:  [12-18] 12 (10/27 0759) BP: (121-158)/(64-96) 133/65 (10/27 0759) SpO2:  [92 %-100 %] 95 % (10/27 0759) Weight:  [59 kg] 59 kg (10/26 1024)  Intake/Output from previous day: 10/26 0701 - 10/27 0700 In: 1266.3 [I.V.:1071.3; IV Piggyback:195] Out: -  Intake/Output this shift: No intake/output data recorded.  Physical Exam Vitals and nursing note reviewed.  Constitutional:      General: She is not in acute distress.    Appearance: She is not ill-appearing, toxic-appearing or diaphoretic.  HENT:     Head: Normocephalic and atraumatic.  Pulmonary:  Effort: Pulmonary effort is normal. No respiratory distress.  Skin:    General: Skin is warm and dry.  Neurological:     Mental Status: She is alert and oriented to person, place, and time.  Psychiatric:        Mood and Affect: Mood normal.        Behavior: Behavior normal.     Lab Results:  Recent Labs    02/08/22 1026 02/09/22 0431  WBC 18.3* 13.7*  HGB 15.3* 13.4  HCT 47.5* 41.2  PLT 189 180   BMET Recent Labs    02/08/22 1026 02/09/22 0431  NA 140 139  K 2.7* 3.2*  CL 106 113*  CO2 22 21*  GLUCOSE 122* 110*  BUN 20 15  CREATININE 1.06* 0.87  CALCIUM 8.7* 8.1*   Studies/Results: CT Renal Stone Study  Result Date: 02/08/2022 CLINICAL DATA:  Recurrent urinary tract infection, flank pain, low back pain EXAM: CT ABDOMEN AND PELVIS WITHOUT CONTRAST TECHNIQUE: Multidetector CT imaging of the abdomen and pelvis was performed following the standard protocol without  IV contrast. RADIATION DOSE REDUCTION: This exam was performed according to the departmental dose-optimization program which includes automated exposure control, adjustment of the mA and/or kV according to patient size and/or use of iterative reconstruction technique. COMPARISON:  04/10/2020 FINDINGS: Lower chest: Mild left basilar atelectasis. Cardiac size within normal limits. Hepatobiliary: No focal liver abnormality is seen. Status post cholecystectomy. No biliary dilatation. Pancreas: Unremarkable Spleen: Unremarkable Adrenals/Urinary Tract: The adrenal glands are unremarkable. The kidneys are normal in size and position. There is extensive bilateral perinephric stranding suggesting an underlying bilateral infectious or inflammatory nephritis. No loculated perinephric fluid collections. There is superimposed minimal left hydronephrosis secondary to a minimally obstructing 4 mm calculus within the distal left ureter seen 1-2 cm proximal to the left ureterovesicular junction. Numerous additional nonobstructing calculi are noted within the mid and lower pole of the left kidney measuring up to 4 mm. No hydronephrosis on the right. No nephro or urolithiasis on the right. The bladder is unremarkable. Stomach/Bowel: Stomach is within normal limits. Appendix appears normal. No evidence of bowel wall thickening, distention, or inflammatory changes. Vascular/Lymphatic: Aortic atherosclerosis. No pathologically enlarged abdominal or pelvic lymph nodes. Shotty left pararenal adenopathy may be reactive in nature. Reproductive: Status post hysterectomy. No adnexal masses. Other: No abdominal wall hernia or abnormality. No abdominopelvic ascites. Musculoskeletal: Osseous structures are age-appropriate. No acute bone abnormality. No lytic or blastic bone lesion. IMPRESSION: 1. Minimally obstructing 4 mm calculus within the distal left ureter resulting in minimal left hydronephrosis. Superimposed mild left nonobstructing  nephrolithiasis. 2. Extensive bilateral perinephric stranding suggesting an underlying infectious or inflammatory nephritis. No loculated perinephric fluid collections. Correlation with urinalysis and urine culture is recommended for further evaluation. 3. Aortic atherosclerosis. Aortic Atherosclerosis (ICD10-I70.0). Electronically Signed   By: Helyn Numbers M.D.   On: 02/08/2022 12:12    Assessment & Plan: 56 year old female admitted with a 4 mm minimally obstructive distal left ureteral stone with UTI.  Her leukocytosis is improving and she remains afebrile, VSS, however her blood cultures are growing ESBL E. coli.  We discussed various options at this point including proceeding to the OR for left ureteral stent placement with possible ureteroscopy and laser lithotripsy of the stone if she does not appear grossly infected.  Alternatively, we discussed deferring surgery for now, continuing antibiotics, and only proceeding with surgery if she clinically deteriorates.  Based on this conversation, she wishes to proceed to the OR today.  We discussed that regardless of whether or not she undergoes ureteroscopy today, she should expect to wake up with a left ureteral stent.  We discussed stent symptoms including flank pain, bladder pain, dysuria, urgency, frequency, and gross hematuria.  We discussed the need for a possible staged procedure if she does not undergo ureteroscopy today requiring definitive stone management in approximately 3 weeks once she completes a course of antibiotics.  She expressed understanding of all of this.  Please keep her n.p.o. in advance of procedure later today.  Our team will get her posted for cystoscopy and left ureteral stent placement with possible left ureteroscopy and laser lithotripsy with Dr. Lonna Cobb.  Carman Ching, PA-C 02/09/2022

## 2022-02-09 NOTE — Plan of Care (Signed)

## 2022-02-09 NOTE — Hospital Course (Addendum)
56 year old female with past medical history of renal stones presented to the emergency room on 10/26 with complaints of dysuria as well as fever and nausea and vomiting.  Initial work-up revealed pyelonephritis with blood cultures positive for ESBL E. coli.  (Sepsis ruled out.)  in addition, patient found to have minimally obstructing 4 mm stone in the distal left ureter causing minimal left-sided hydronephrosis.  Although this itself would not warrant intervention, by 10/27, urine growing out ESBL E. Coli and urology made decision to take patient for cystoscopy with stent placement this afternoon.

## 2022-02-09 NOTE — Transfer of Care (Signed)
Immediate Anesthesia Transfer of Care Note  Patient: Martha Welch  Procedure(s) Performed: CYSTOSCOPY WITH STENT PLACEMENT (Left: Ureter) CYSTOSCOPY/URETEROSCOPY/HOLMIUM LASER/STENT PLACEMENT (Left)  Patient Location: PACU  Anesthesia Type:General  Level of Consciousness: sedated  Airway & Oxygen Therapy: Patient Spontanous Breathing and Patient connected to face mask oxygen  Post-op Assessment: Report given to RN and Post -op Vital signs reviewed and stable  Post vital signs: Reviewed  Last Vitals:  Vitals Value Taken Time  BP    Temp    Pulse    Resp    SpO2      Last Pain:  Vitals:   02/09/22 1500  TempSrc: Temporal  PainSc: 0-No pain      Patients Stated Pain Goal: 0 (22/29/79 8921)  Complications: No notable events documented.

## 2022-02-09 NOTE — Op Note (Signed)
Preoperative diagnosis:  Left distal ureteral calculus Pyelonephritis with bacteremia  Postoperative diagnosis:  Same  Procedure:  Cystoscopy Left ureteroscopy and stone removal Ureteroscopic laser lithotripsy Left ureteral stent placement (59F/22 cm)  Left retrograde pyelography with interpretation Intraoperative fluoroscopy < 30 minutes  Surgeon: Lorin Picket C. Rocco Kerkhoff, M.D.  Anesthesia: General  Complications: None  Intraoperative findings:  Cystoscopy-bladder mucosa with patchy erythema and sediment endoscopically consistent with cystitis.  No solid or papillary lesions.  UOs normal-appearing bilaterally Ureteroscopy-inflammatory changes distal ureter with not impacted stone in the midportion of the distal ureter Left retrograde pyelography post procedure showed no filling defects, stone fragments or contrast extravasation.  There was moderate hydronephrosis and hydroureter to the distal ureter  EBL: Minimal  Specimens: Calculus fragments for analysis   Indication: Martha Welch is a 56 y.o. female admitted 02/08/2022 with a 1 week history of left flank pain and subjective fever.  At the time of admission CT showed a minimally obstructing 4 mm left distal ureteral calculus and bilateral perinephric stranding.  WBC was 18.7 and lactate was normal.  She was afebrile.  She was started on IV antibiotics and this morning her WBC increasing and max temp was 99.3.  Due to stone size and location it was elected to proceed with stent placement and possible ureteroscopy with stone removal if urine proximal to the stone was grossly normal in appearance.  After reviewing the management options for treatment, the patient elected to proceed with the above surgical procedure(s). We have discussed the potential benefits and risks of the procedure, side effects of the proposed treatment, the likelihood of the patient achieving the goals of the procedure, and any potential problems that might occur  during the procedure or recuperation. Informed consent has been obtained.  Description of procedure:  The patient was taken to the operating room and general anesthesia was induced.  The patient was placed in the dorsal lithotomy position, prepped and draped in the usual sterile fashion, and preoperative antibiotics were administered. A preoperative time-out was performed.   A 21 French cystoscope was lubricated and passed per urethra.  Panendoscopy was performed with findings as described above  Attention was directed to the left ureteral orifice and a 0.038 Sensor wire was then advanced up the ureter into the renal pelvis under fluoroscopic guidance.  A 5 French open ended ureteral catheter was then advanced over the wire to the region of the renal pelvis and the guidewire was removed.  10 mL of grossly clear urine was aspirated and sent for culture  The guidewire was replaced and the ureteral catheter was removed.  A 4.5 Fr semirigid ureteroscope was then advanced into the ureter next to the guidewire and the calculus was identified as described above.  The stone was then fragmented with a 200 m holmium laser fiber on a setting of 0.3J/30 Hz  All fragments were then removed from the ureter with a zero tip nitinol basket.  Reinspection of the ureter revealed no remaining visible stones or fragments.   Retrograde pyelogram was performed with findings as described above.  A 59F/22 cm Contour ureteral stent was placed under fluoroscopic guidance.  The wire was then removed with an adequate stent curl noted in the renal pelvis as well as in the bladder.  The bladder was then emptied and the procedure ended.  The patient appeared to tolerate the procedure well and without complications.  After anesthetic reversal the patient was transported to the PACU in stable condition.  Plan: Indwelling ureteral stent x7-10 days Follow-up office visit for stent removal Tamsulosin and oxybutynin for stent  irritation   John Giovanni, MD

## 2022-02-09 NOTE — Progress Notes (Signed)
PHARMACY - PHYSICIAN COMMUNICATION CRITICAL VALUE ALERT - BLOOD CULTURE IDENTIFICATION (BCID)  Martha Welch is an 56 y.o. female who presented to [facility] on 02/08/2022 with a chief complaint of back pain with urinary frequency.  Assessment:  1/4 bottles(anaerobic) GNR, enterobacterales, E.coli and CTX-M ESBL  Name of physician (or Provider) Contacted: Sharion Settler  Current antibiotics: Meropenem  Changes to prescribed antibiotics recommended:  Patient is on recommended antibiotics - No changes needed  Results for orders placed or performed during the hospital encounter of 02/08/22  Blood Culture ID Panel (Reflexed) (Collected: 02/08/2022  1:27 PM)  Result Value Ref Range   Enterococcus faecalis NOT DETECTED NOT DETECTED   Enterococcus Faecium NOT DETECTED NOT DETECTED   Listeria monocytogenes NOT DETECTED NOT DETECTED   Staphylococcus species NOT DETECTED NOT DETECTED   Staphylococcus aureus (BCID) NOT DETECTED NOT DETECTED   Staphylococcus epidermidis NOT DETECTED NOT DETECTED   Staphylococcus lugdunensis NOT DETECTED NOT DETECTED   Streptococcus species NOT DETECTED NOT DETECTED   Streptococcus agalactiae NOT DETECTED NOT DETECTED   Streptococcus pneumoniae NOT DETECTED NOT DETECTED   Streptococcus pyogenes NOT DETECTED NOT DETECTED   A.calcoaceticus-baumannii NOT DETECTED NOT DETECTED   Bacteroides fragilis NOT DETECTED NOT DETECTED   Enterobacterales DETECTED (A) NOT DETECTED   Enterobacter cloacae complex NOT DETECTED NOT DETECTED   Escherichia coli DETECTED (A) NOT DETECTED   Klebsiella aerogenes NOT DETECTED NOT DETECTED   Klebsiella oxytoca NOT DETECTED NOT DETECTED   Klebsiella pneumoniae NOT DETECTED NOT DETECTED   Proteus species NOT DETECTED NOT DETECTED   Salmonella species NOT DETECTED NOT DETECTED   Serratia marcescens NOT DETECTED NOT DETECTED   Haemophilus influenzae NOT DETECTED NOT DETECTED   Neisseria meningitidis NOT DETECTED NOT DETECTED    Pseudomonas aeruginosa NOT DETECTED NOT DETECTED   Stenotrophomonas maltophilia NOT DETECTED NOT DETECTED   Candida albicans NOT DETECTED NOT DETECTED   Candida auris NOT DETECTED NOT DETECTED   Candida glabrata NOT DETECTED NOT DETECTED   Candida krusei NOT DETECTED NOT DETECTED   Candida parapsilosis NOT DETECTED NOT DETECTED   Candida tropicalis NOT DETECTED NOT DETECTED   Cryptococcus neoformans/gattii NOT DETECTED NOT DETECTED   CTX-M ESBL DETECTED (A) NOT DETECTED   Carbapenem resistance IMP NOT DETECTED NOT DETECTED   Carbapenem resistance KPC NOT DETECTED NOT DETECTED   Carbapenem resistance NDM NOT DETECTED NOT DETECTED   Carbapenem resist OXA 48 LIKE NOT DETECTED NOT DETECTED   Carbapenem resistance VIM NOT DETECTED NOT DETECTED    [name] [name] 02/09/2022  2:23 AM

## 2022-02-09 NOTE — Progress Notes (Addendum)
[facility] Progress Note  Patient: Martha Welch    PPI:[id number]  DOA: 02/08/2022    Date of Service: the patient was seen and examined on 02/09/2022  Brief hospital course: 56 year old female with past medical history of renal stones presented to the emergency room on 10/26 with complaints of dysuria as well as fever and nausea and vomiting.  Initial work-up revealed pyelonephritis with blood cultures positive for ESBL E. coli.  (Sepsis ruled out.)  in addition, patient found to have minimally obstructing 4 mm stone in the distal left ureter causing minimal left-sided hydronephrosis.  Although this itself would not warrant intervention, by 10/27, urine growing out ESBL E. Coli and urology made decision to take patient for cystoscopy with stent placement this afternoon.  Assessment and Plan: Assessment and Plan: * Acute pyelonephritis Patient presenting with bilateral CVA tenderness and and CT demonstrating extensive perinephritic inflammation.  Presentation complicated by partially obstructing kidney stone.  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  Following admission, positive blood culture for ESBL E. coli.  Continue meropenem.  Urology taking patient for cystoscopy and stent placement.  Medication for pain and nausea  Kidney stone Patient has a history of recurrent nephrolithiasis.  No previous stone analysis within chart however urinalysis in 2017 demonstrated calcium oxalate.  For stent placement.  Given recurrent nature, patient may benefit from a thiazide in the future if dietary changes are not adequate to prevent stones  AKI (acute kidney injury) ([place]) In the setting of poor p.o. intake due to acute illness, pyelonephritis and mild hydronephrosis.  Resolved with IV fluids.  Hypokalemia Improved, still low.  Replacing.  Prediabetes Hyperglycemic on presentation likely in the setting of acute illness, however prior history of hyperglycemia.  A1c at 5.8,  prediabetic       Body mass index is 23.38 kg/m.        Consultants: Urology  Procedures: For cystoscopy with stent placement 10/27  Antimicrobials: IV meropenem 10/26-present  Code Status: Full code   Subjective: Patient complains of generalized CVA tenderness, thirst  Objective: Vital signs were reviewed and unremarkable. Vitals:   02/09/22 0759 02/09/22 1500  BP: 133/65 (!) 146/97  Pulse: 90 88  Resp: 12 12  Temp: 99.1 F (37.3 C) (!) 97.5 F (36.4 C)  SpO2: 95% 94%    Intake/Output Summary (Last 24 hours) at 02/09/2022 1625 Last data filed at 02/09/2022 1625 Gross per 24 hour  Intake 1666.31 ml  Output 120 ml  Net 1546.31 ml   Filed Weights   02/08/22 1024 02/09/22 1500  Weight: 59 kg 59.9 kg   Body mass index is 23.38 kg/m.  Exam:  General: Alert and oriented x3, mild distress HEENT:, Normocephalic, atraumatic, mucous membranes slightly dry Cardiovascular: Regular rate and rhythm, S1-S2 Respiratory: Clear to auscultation bilaterally Abdomen: Soft, nontender, nondistended, positive bowel sounds Musculoskeletal: No clubbing or cyanosis or edema Skin: No skin breaks, tears or lesions Psychiatry: Appropriate, no evidence of psychoses Neurology: Focal deficits  Data Reviewed: Noted positive blood culture , A1c of 5.8, potassium 3.2, renal function normalized.  White blood cell count trending downward  Disposition:  Status is: Inpatient     Anticipated discharge date: 10/29  Remaining issues to be resolved so that patient can be discharged:  -Stent placement by urology -Improvement in antibiotics   Family Communication: We will call daughter DVT Prophylaxis: Place and maintain sequential compression device Start: 02/09/22 1504 SCDs Start: 02/08/22 1414    Author: Annita Brod ,MD 02/09/2022 4:25  PM  To reach On-call, see care teams to locate the attending and reach out via [email or web address]. Between 7PM-7AM, please contact  night-coverage If you still have difficulty reaching the attending provider, please page the [facility] (Director on Call) for [facility] on amion for assistance.

## 2022-02-09 NOTE — Anesthesia Procedure Notes (Signed)
Procedure Name: LMA Insertion Date/Time: 02/09/2022 3:46 PM  Performed by: Rolla Plate, CRNAPre-anesthesia Checklist: Patient identified, Patient being monitored, Timeout performed, Emergency Drugs available and Suction available Patient Re-evaluated:Patient Re-evaluated prior to induction Oxygen Delivery Method: Circle system utilized Preoxygenation: Pre-oxygenation with 100% oxygen Induction Type: IV induction Ventilation: Mask ventilation without difficulty LMA: LMA inserted LMA Size: 3.0 Tube type: Oral Number of attempts: 1 Placement Confirmation: positive ETCO2 and breath sounds checked- equal and bilateral Tube secured with: Tape Dental Injury: Teeth and Oropharynx as per pre-operative assessment

## 2022-02-09 NOTE — Anesthesia Preprocedure Evaluation (Signed)
Anesthesia Evaluation  Patient identified by MRN, date of birth, ID band Patient awake    Reviewed: Allergy & Precautions, H&P , NPO status , Patient's Chart, lab work & pertinent test results, reviewed documented beta blocker date and time   Airway Mallampati: II  TM Distance: >3 FB Neck ROM: full    Dental  (+) Teeth Intact   Pulmonary neg pulmonary ROS, Current Smoker,    Pulmonary exam normal        Cardiovascular Exercise Tolerance: Good negative cardio ROS Normal cardiovascular exam Rate:Normal     Neuro/Psych negative neurological ROS  negative psych ROS   GI/Hepatic negative GI ROS, Neg liver ROS,   Endo/Other  negative endocrine ROS  Renal/GU Renal disease  negative genitourinary   Musculoskeletal   Abdominal   Peds  Hematology  (+) Blood dyscrasia, anemia ,   Anesthesia Other Findings   Reproductive/Obstetrics negative OB ROS                             Anesthesia Physical Anesthesia Plan  ASA: 2 and emergent  Anesthesia Plan: General LMA   Post-op Pain Management:    Induction:   PONV Risk Score and Plan:   Airway Management Planned:   Additional Equipment:   Intra-op Plan:   Post-operative Plan:   Informed Consent: I have reviewed the patients History and Physical, chart, labs and discussed the procedure including the risks, benefits and alternatives for the proposed anesthesia with the patient or authorized representative who has indicated his/her understanding and acceptance.       Plan Discussed with: CRNA  Anesthesia Plan Comments:         Anesthesia Quick Evaluation

## 2022-02-09 NOTE — Anesthesia Postprocedure Evaluation (Signed)
Anesthesia Post Note  Patient: Martha Welch [facility]  Procedure(s) Performed: CYSTOSCOPY WITH STENT PLACEMENT (Left: Ureter) CYSTOSCOPY/URETEROSCOPY/HOLMIUM LASER/STENT PLACEMENT (Left)  Patient location during evaluation: PACU Anesthesia Type: General Level of consciousness: awake and alert Pain management: pain level controlled Vital Signs Assessment: post-procedure vital signs reviewed and stable Respiratory status: spontaneous breathing, nonlabored ventilation, respiratory function stable and patient connected to nasal cannula oxygen Cardiovascular status: blood pressure returned to baseline and stable Postop Assessment: no apparent nausea or vomiting Anesthetic complications: no   No notable events documented.   Last Vitals:  Vitals:   02/09/22 1634 02/09/22 1645  BP: (!) 92/51 111/63  Pulse: 78 74  Resp: (!) 9 17  Temp: 36.8 C   SpO2: 96% 97%    Last Pain:  Vitals:   02/09/22 1634  TempSrc:   PainSc: Asleep                 Ilene Qua

## 2022-02-09 NOTE — Interval H&P Note (Signed)
History and Physical Interval Note:  Max temp last 24 hours 99.3.  WBC has decreased to 13.7.  Blood culture growing E. coli in 1 bottle.  Will proceed with cystoscopy and left ureteral stent placement.  Based on small distal stone if no significant pyuria proximal to the stone we will proceed with ureteroscopy and stone removal.  The procedure was discussed in detail including potential risks of bleeding, worsening sepsis and ureteral injury.  Possibility of stent symptoms was also discussed.  All questions were answered and she desires to proceed.   02/09/2022 3:26 PM  Martha Welch  has presented today for surgery, with the diagnosis of Left Ureteral Stone, Bacteremia.  The various methods of treatment have been discussed with the patient and family. After consideration of risks, benefits and other options for treatment, the patient has consented to  Procedure(s): CYSTOSCOPY WITH STENT PLACEMENT (Left) CYSTOSCOPY/URETEROSCOPY/HOLMIUM LASER/STENT PLACEMENT (Left) as a surgical intervention.  The patient's history has been reviewed, patient examined, no change in status, stable for surgery.  I have reviewed the patient's chart and labs.  Questions were answered to the patient's satisfaction.     Trigg

## 2022-02-09 NOTE — Consult Note (Signed)
Pharmacy Antibiotic Note  Martha Welch is a 56 y.o. female admitted on 02/08/2022 with  pyelonephritis with hx of ESBL infection .  Pharmacy has been consulted for meropenem dosing.  -10/26: Blood cx 1 of 4 bottles: GNR,  BCID: E.coli ESBL   Plan: Scr improved 1.06>>0.87 Will adjust from Meropenem 1gm q 12 hrs to q 8 hrs.   Crcl 59.7 ml/min  F/u Blood cx, Urine cx, Scr   Height: 5\' 3"  (160 cm) Weight: 59 kg (130 lb) IBW/kg (Calculated) : 52.4  Temp (24hrs), Avg:98.6 F (37 C), Min:97.5 F (36.4 C), Max:99.3 F (37.4 C)  Recent Labs  Lab 02/08/22 1026 02/08/22 1327 02/09/22 0431  WBC 18.3*  --  13.7*  CREATININE 1.06*  --  0.87  LATICACIDVEN  --  1.4  --      Estimated Creatinine Clearance: 59.7 mL/min (by C-G formula based on SCr of 0.87 mg/dL).    Allergies  Allergen Reactions   Amoxicillin Other (See Comments)    Yeast infection    Antimicrobials this admission: Ceftriaxone 10/26 >> 10/26 Meropenem 10/26 >>   Dose adjustments this admission:n/a  Microbiology results: 10/26 BCx: GNR   (BCID: ESBL E.coli) 10/26 UCx: in process   Thank you for allowing pharmacy to be a part of this patient's care.  Chinita Greenland PharmD Clinical Pharmacist 02/09/2022

## 2022-02-10 ENCOUNTER — Encounter: Payer: Self-pay | Admitting: Urology

## 2022-02-10 LAB — PROCALCITONIN: Procalcitonin: <0.1 ng/mL

## 2022-02-10 LAB — BASIC METABOLIC PANEL
Anion gap: 7 (ref 5–15)
BUN: 20 mg/dL (ref 6–20)
CO2: 23 mmol/L (ref 22–32)
Calcium: 8.2 mg/dL — ABNORMAL LOW (ref 8.9–10.3)
Chloride: 111 mmol/L (ref 98–111)
Creatinine, Ser: 0.77 mg/dL (ref 0.44–1.00)
GFR, Estimated: >60 mL/min (ref >60)
Glucose, Bld: 217 mg/dL — ABNORMAL HIGH (ref 70–99)
Potassium: 3.2 mmol/L — ABNORMAL LOW (ref 3.5–5.1)
Sodium: 141 mmol/L (ref 135–145)

## 2022-02-10 LAB — CBC
HCT: 42.9 % (ref 36.0–46.0)
Hemoglobin: 13.9 g/dL (ref 12.0–15.0)
MCH: 28.3 pg (ref 26.0–34.0)
MCHC: 32.4 g/dL (ref 30.0–36.0)
MCV: 87.4 fL (ref 80.0–100.0)
Platelets: 235 10*3/uL (ref 150–400)
RBC: 4.91 MIL/uL (ref 3.87–5.11)
RDW: 14.8 % (ref 11.5–15.5)
WBC: 8.7 10*3/uL (ref 4.0–10.5)
nRBC: 0 % (ref 0.0–0.2)

## 2022-02-10 LAB — URINE CULTURE: Culture: NO GROWTH

## 2022-02-10 MED ORDER — TAMSULOSIN HCL 0.4 MG PO CAPS: 0.4000 mg | ORAL_CAPSULE | Freq: Every day | ORAL | 0 refills | Status: AC

## 2022-02-10 MED ORDER — FLUCONAZOLE 150 MG PO TABS: ORAL_TABLET | ORAL | 0 refills | Status: DC

## 2022-02-10 MED ORDER — OXYBUTYNIN CHLORIDE 5 MG PO TABS: 5.0000 mg | ORAL_TABLET | Freq: Three times a day (TID) | ORAL | 0 refills | Status: DC | PRN

## 2022-02-10 MED ORDER — POTASSIUM CHLORIDE CRYS ER 20 MEQ PO TBCR
40.0000 meq | EXTENDED_RELEASE_TABLET | Freq: Once | ORAL | Status: AC
  Administered 2022-02-10: 40 meq via ORAL
  Filled 2022-02-10: qty 2

## 2022-02-10 MED ORDER — AMOXICILLIN-POT CLAVULANATE 875-125 MG PO TABS: 1.0000 | ORAL_TABLET | Freq: Two times a day (BID) | ORAL | 0 refills | Status: DC

## 2022-02-10 NOTE — Discharge Summary (Signed)
Physician Discharge Summary   Patient: Martha Welch MRN: [id number] DOB: February 21, 1966  Admit date:     02/08/2022  Discharge date: {dischdate:26783}  Discharge Physician: Hollice Espy   PCP: Pcp, No   Recommendations at discharge:  {Tip this will not be part of the note when signed- Example include specific recommendations for outpatient follow-up, pending tests to follow-up on. (Optional):26781}  ***  Discharge Diagnoses: Principal Problem:   Acute pyelonephritis Active Problems:   Kidney stone   AKI (acute kidney injury) (HCC)   Hypokalemia   Prediabetes   Pyelonephritis  Resolved Problems:   * No resolved hospital problems. *  Hospital Course: 56 year old female with past medical history of renal stones presented to the emergency room on 10/26 with complaints of dysuria as well as fever and nausea and vomiting.  Initial work-up revealed pyelonephritis with blood cultures positive for ESBL E. coli.  (Sepsis ruled out.)  in addition, patient found to have minimally obstructing 4 mm stone in the distal left ureter causing minimal left-sided hydronephrosis.  Although this itself would not warrant intervention, by 10/27, urine growing out ESBL E. Coli and urology made decision to take patient for cystoscopy with stent placement this afternoon.  Assessment and Plan: * Acute pyelonephritis Patient presenting with bilateral CVA tenderness and and CT demonstrating extensive perinephritic inflammation.  Presentation complicated by partially obstructing kidney stone.  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  Following admission, positive blood culture for ESBL E. coli.  Continue meropenem.  Urology taking patient for cystoscopy and stent placement.  Medication for pain and nausea  Kidney stone Patient has a history of recurrent nephrolithiasis.  No previous stone analysis within chart however urinalysis in 2017 demonstrated calcium oxalate.  For stent placement.  Given  recurrent nature, patient may benefit from a thiazide in the future if dietary changes are not adequate to prevent stones  AKI (acute kidney injury) (HCC) In the setting of poor p.o. intake due to acute illness, pyelonephritis and mild hydronephrosis.  Resolved with IV fluids.  Hypokalemia Improved, still low.  Replacing.  Prediabetes Hyperglycemic on presentation likely in the setting of acute illness, however prior history of hyperglycemia.  A1c at 5.8, prediabetic      {Tip this will not be part of the note when signed Body mass index is 23.38 kg/m. , ,  (Optional):26781}  {(NOTE) Pain control PDMP Statment (Optional):26782} Consultants: *** Procedures performed: ***  Disposition: {Plan; Disposition:26390} Diet recommendation:  Discharge Diet Orders (From admission, onward)     Start     Ordered   02/10/22 0000  Diet - low sodium heart healthy        02/10/22 1532           {Diet_Plan:26776} DISCHARGE MEDICATION: Allergies as of 02/10/2022       Reactions   Amoxicillin Other (See Comments)   Yeast infection        Medication List     TAKE these medications    amoxicillin-clavulanate 875-125 MG tablet Commonly known as: AUGMENTIN Take 1 tablet by mouth 2 (two) times daily for 9 days.   oxybutynin 5 MG tablet Commonly known as: DITROPAN Take 1 tablet (5 mg total) by mouth every 8 (eight) hours as needed for bladder spasms (Urinary frequency, urgency, spasm).   tamsulosin 0.4 MG Caps capsule Commonly known as: FLOMAX Take 1 capsule (0.4 mg total) by mouth daily for 14 days. Start taking on: February 11, 2022  Follow-up Information     Stoioff, Verna Czech, MD. Schedule an appointment as soon as possible for a visit in 1 week(s).   Specialty: Urology Contact information: [address] [place] [address] [phone]                Discharge Exam: Ceasar Mons Weights   02/08/22 1024 02/09/22 1500  Weight: 59 kg 59.9 kg    ***  Condition at discharge: {DC Condition:26389}  The results of significant diagnostics from this hospitalization (including imaging, microbiology, ancillary and laboratory) are listed below for reference.   Imaging Studies: DG OR UROLOGY CYSTO IMAGE ([facility] ONLY)  Result Date: 02/09/2022 There is no interpretation for this exam.  This order is for images obtained during a surgical procedure.  Please See "Surgeries" Tab for more information regarding the procedure.   CT Renal Stone Study  Result Date: 02/08/2022 CLINICAL DATA:  Recurrent urinary tract infection, flank pain, low back pain EXAM: CT ABDOMEN AND PELVIS WITHOUT CONTRAST TECHNIQUE: Multidetector CT imaging of the abdomen and pelvis was performed following the standard protocol without IV contrast. RADIATION DOSE REDUCTION: This exam was performed according to the departmental dose-optimization program which includes automated exposure control, adjustment of the mA and/or kV according to patient size and/or use of iterative reconstruction technique. COMPARISON:  04/10/2020 FINDINGS: Lower chest: Mild left basilar atelectasis. Cardiac size within normal limits. Hepatobiliary: No focal liver abnormality is seen. Status post cholecystectomy. No biliary dilatation. Pancreas: Unremarkable Spleen: Unremarkable Adrenals/Urinary Tract: The adrenal glands are unremarkable. The kidneys are normal in size and position. There is extensive bilateral perinephric stranding suggesting an underlying bilateral infectious or inflammatory nephritis. No loculated perinephric fluid collections. There is superimposed minimal left hydronephrosis secondary to a minimally obstructing 4 mm calculus within the distal left ureter seen 1-2 cm proximal to the left ureterovesicular junction. Numerous additional nonobstructing calculi are noted within the mid and lower pole of the left kidney measuring up to 4 mm. No hydronephrosis on the right. No nephro or  urolithiasis on the right. The bladder is unremarkable. Stomach/Bowel: Stomach is within normal limits. Appendix appears normal. No evidence of bowel wall thickening, distention, or inflammatory changes. Vascular/Lymphatic: Aortic atherosclerosis. No pathologically enlarged abdominal or pelvic lymph nodes. Shotty left pararenal adenopathy may be reactive in nature. Reproductive: Status post hysterectomy. No adnexal masses. Other: No abdominal wall hernia or abnormality. No abdominopelvic ascites. Musculoskeletal: Osseous structures are age-appropriate. No acute bone abnormality. No lytic or blastic bone lesion. IMPRESSION: 1. Minimally obstructing 4 mm calculus within the distal left ureter resulting in minimal left hydronephrosis. Superimposed mild left nonobstructing nephrolithiasis. 2. Extensive bilateral perinephric stranding suggesting an underlying infectious or inflammatory nephritis. No loculated perinephric fluid collections. Correlation with urinalysis and urine culture is recommended for further evaluation. 3. Aortic atherosclerosis. Aortic Atherosclerosis (ICD10-I70.0). Electronically Signed   By: Helyn Numbers M.D.   On: 02/08/2022 12:12    Microbiology: Results for orders placed or performed during the hospital encounter of 02/08/22  Urine Culture     Status: Abnormal (Preliminary result)   Collection Time: 02/08/22 10:26 AM   Specimen: Urine, Random  Result Value Ref Range Status   Specimen Description   Final    URINE, RANDOM Performed at [facility], [address], [place], [place] [address]    Special Requests   Final    NONE Performed at [facility], [address], [place], [place] [address]    Culture (A)  Final    >=  100,000 COLONIES/mL ESCHERICHIA COLI CULTURE REINCUBATED FOR BETTER GROWTH Performed at [facility], [place] [address], [place], [place] [address]    Report Status PENDING  Incomplete  Blood culture (routine x 2)     Status: None  (Preliminary result)   Collection Time: 02/08/22  1:27 PM   Specimen: BLOOD  Result Value Ref Range Status   Specimen Description BLOOD BLOOD RIGHT FOREARM  Final   Special Requests   Final    BOTTLES DRAWN AEROBIC AND ANAEROBIC Blood Culture adequate volume   Culture   Final    NO GROWTH < 24 HOURS Performed at [facility], [address], [place], [place] [address]    Report Status PENDING  Incomplete  Blood culture (routine x 2)     Status: Abnormal (Preliminary result)   Collection Time: 02/08/22  1:27 PM   Specimen: BLOOD  Result Value Ref Range Status   Specimen Description   Final    BLOOD LEFT ANTECUBITAL Performed at [facility], [address], [place], [place] [address]    Special Requests   Final    BOTTLES DRAWN AEROBIC AND ANAEROBIC Blood Culture adequate volume Performed at [facility], [address] [place], [place] [address]    Culture  Setup Time   Final    Organism ID to follow [place] CRITICAL RESULT CALLED TO, READ BACK BY AND VERIFIED WITH: WALID NAZARI @0212  ON 02/09/22 SKL Performed at [facility], [address] [place], [place] [address]    Culture (A)  Final    ESCHERICHIA COLI SUSCEPTIBILITIES TO FOLLOW Performed at [facility], [place] [address], [place], [place] [address]    Report Status PENDING  Incomplete  Blood Culture ID Panel (Reflexed)     Status: Abnormal   Collection Time: 02/08/22  1:27 PM  Result Value Ref Range Status   Enterococcus faecalis NOT DETECTED NOT DETECTED Final   Enterococcus Faecium NOT DETECTED NOT DETECTED Final   Listeria monocytogenes NOT DETECTED NOT DETECTED Final   Staphylococcus species NOT DETECTED NOT DETECTED Final   Staphylococcus aureus (BCID) NOT DETECTED NOT DETECTED Final   Staphylococcus epidermidis NOT DETECTED NOT DETECTED Final   Staphylococcus lugdunensis NOT DETECTED NOT DETECTED Final   Streptococcus species  NOT DETECTED NOT DETECTED Final   Streptococcus agalactiae NOT DETECTED NOT DETECTED Final   Streptococcus pneumoniae NOT DETECTED NOT DETECTED Final   Streptococcus pyogenes NOT DETECTED NOT DETECTED Final   A.calcoaceticus-baumannii NOT DETECTED NOT DETECTED Final   Bacteroides fragilis NOT DETECTED NOT DETECTED Final   Enterobacterales DETECTED (A) NOT DETECTED Final    Comment: Enterobacterales represent a large order of gram negative bacteria, not a single organism. CRITICAL RESULT CALLED TO, READ BACK BY AND VERIFIED WITH: WALID NAZARI @0212  ON 02/09/22 SKL    Enterobacter cloacae complex NOT DETECTED NOT DETECTED Final   Escherichia coli DETECTED (A) NOT DETECTED Final    Comment: CRITICAL RESULT CALLED TO, READ BACK BY AND VERIFIED WITH: WALID NAZARI @0212  ON 02/09/22 SKL    Klebsiella aerogenes NOT DETECTED NOT DETECTED Final   Klebsiella oxytoca NOT DETECTED NOT DETECTED Final   Klebsiella pneumoniae NOT DETECTED NOT DETECTED Final   Proteus species NOT DETECTED NOT DETECTED Final   Salmonella species NOT DETECTED NOT DETECTED Final   Serratia marcescens NOT DETECTED NOT DETECTED Final   Haemophilus influenzae NOT DETECTED NOT DETECTED Final   Neisseria meningitidis NOT DETECTED NOT DETECTED Final  Pseudomonas aeruginosa NOT DETECTED NOT DETECTED Final   Stenotrophomonas maltophilia NOT DETECTED NOT DETECTED Final   Candida albicans NOT DETECTED NOT DETECTED Final   Candida auris NOT DETECTED NOT DETECTED Final   Candida glabrata NOT DETECTED NOT DETECTED Final   Candida krusei NOT DETECTED NOT DETECTED Final   Candida parapsilosis NOT DETECTED NOT DETECTED Final   Candida tropicalis NOT DETECTED NOT DETECTED Final   Cryptococcus neoformans/gattii NOT DETECTED NOT DETECTED Final   CTX-M ESBL DETECTED (A) NOT DETECTED Final    Comment: CRITICAL RESULT CALLED TO, READ BACK BY AND VERIFIED WITH: WALID NAZARI @0212  ON 02/09/22 SKL (NOTE) Extended spectrum beta-lactamase  detected. Recommend a carbapenem as initial therapy.      Carbapenem resistance IMP NOT DETECTED NOT DETECTED Final   Carbapenem resistance KPC NOT DETECTED NOT DETECTED Final   Carbapenem resistance NDM NOT DETECTED NOT DETECTED Final   Carbapenem resist OXA 48 LIKE NOT DETECTED NOT DETECTED Final   Carbapenem resistance VIM NOT DETECTED NOT DETECTED Final    Comment: Performed at [facility], [address] [place], [place]    Labs: CBC: Recent Labs  Lab 02/08/22 1026 02/09/22 0431 02/10/22 0502  WBC 18.3* 13.7* 8.7  HGB 15.3* 13.4 13.9  HCT 47.5* 41.2 42.9  MCV 88.6 89.8 87.4  PLT 189 180 235   Basic Metabolic Panel: Recent Labs  Lab 02/08/22 1026 02/09/22 0431 02/10/22 0502  NA 140 139 141  K 2.7* 3.2* 3.2*  CL 106 113* 111  CO2 22 21* 23  GLUCOSE 122* 110* 217*  BUN 20 15 20   CREATININE 1.06* 0.87 0.77  CALCIUM 8.7* 8.1* 8.2*   Liver Function Tests: No results for input(s): "AST", "ALT", "ALKPHOS", "BILITOT", "PROT", "ALBUMIN" in the last 168 hours. CBG: No results for input(s): "GLUCAP" in the last 168 hours.  Discharge time spent: {LESS THAN/GREATER 02/12/22 30 minutes.  Signed: , MD [facility] 02/10/2022

## 2022-02-11 LAB — CULTURE, BLOOD (ROUTINE X 2): Special Requests: ADEQUATE

## 2022-02-12 ENCOUNTER — Encounter (HOSPITAL_COMMUNITY): Payer: Self-pay

## 2022-02-12 ENCOUNTER — Inpatient Hospital Stay: Admit: 2022-02-12 | Payer: Self-pay | Admitting: Internal Medicine

## 2022-02-12 LAB — URINE CULTURE: Culture: >=100000 — AB

## 2022-02-13 ENCOUNTER — Inpatient Hospital Stay
Admission: EM | Admit: 2022-02-13 | Discharge: 2022-02-16 | DRG: 690 | Disposition: A | Discharge status: Home / Self Care | Payer: Self-pay | Attending: Internal Medicine | Admitting: Internal Medicine

## 2022-02-13 ENCOUNTER — Other Ambulatory Visit: Payer: Self-pay

## 2022-02-13 DIAGNOSIS — B9629 Other Escherichia coli [E. coli] as the cause of diseases classified elsewhere: Principal | ICD-10-CM

## 2022-02-13 DIAGNOSIS — Z881 Allergy status to other antibiotic agents status: Secondary | ICD-10-CM

## 2022-02-13 DIAGNOSIS — R7881 Bacteremia: Secondary | ICD-10-CM | POA: Diagnosis present

## 2022-02-13 DIAGNOSIS — F17210 Nicotine dependence, cigarettes, uncomplicated: Secondary | ICD-10-CM | POA: Diagnosis present

## 2022-02-13 DIAGNOSIS — N132 Hydronephrosis with renal and ureteral calculous obstruction: Secondary | ICD-10-CM | POA: Diagnosis present

## 2022-02-13 DIAGNOSIS — Z1612 Extended spectrum beta lactamase (ESBL) resistance: Secondary | ICD-10-CM | POA: Diagnosis present

## 2022-02-13 DIAGNOSIS — N136 Pyonephrosis: Principal | ICD-10-CM | POA: Diagnosis present

## 2022-02-13 DIAGNOSIS — N12 Tubulo-interstitial nephritis, not specified as acute or chronic: Secondary | ICD-10-CM | POA: Diagnosis present

## 2022-02-13 DIAGNOSIS — F17200 Nicotine dependence, unspecified, uncomplicated: Secondary | ICD-10-CM | POA: Diagnosis present

## 2022-02-13 DIAGNOSIS — Z87440 Personal history of urinary (tract) infections: Secondary | ICD-10-CM

## 2022-02-13 DIAGNOSIS — A499 Bacterial infection, unspecified: Secondary | ICD-10-CM

## 2022-02-13 DIAGNOSIS — E870 Hyperosmolality and hypernatremia: Secondary | ICD-10-CM | POA: Diagnosis present

## 2022-02-13 DIAGNOSIS — R319 Hematuria, unspecified: Secondary | ICD-10-CM | POA: Diagnosis not present

## 2022-02-13 DIAGNOSIS — Z79899 Other long term (current) drug therapy: Secondary | ICD-10-CM

## 2022-02-13 DIAGNOSIS — E876 Hypokalemia: Secondary | ICD-10-CM | POA: Diagnosis present

## 2022-02-13 DIAGNOSIS — Z960 Presence of urogenital implants: Secondary | ICD-10-CM | POA: Diagnosis present

## 2022-02-13 DIAGNOSIS — Z716 Tobacco abuse counseling: Secondary | ICD-10-CM

## 2022-02-13 DIAGNOSIS — Z87442 Personal history of urinary calculi: Secondary | ICD-10-CM

## 2022-02-13 DIAGNOSIS — Z9049 Acquired absence of other specified parts of digestive tract: Secondary | ICD-10-CM

## 2022-02-13 DIAGNOSIS — E860 Dehydration: Secondary | ICD-10-CM | POA: Diagnosis present

## 2022-02-13 DIAGNOSIS — Z90710 Acquired absence of both cervix and uterus: Secondary | ICD-10-CM

## 2022-02-13 DIAGNOSIS — N390 Urinary tract infection, site not specified: Secondary | ICD-10-CM | POA: Diagnosis present

## 2022-02-13 DIAGNOSIS — B9620 Unspecified Escherichia coli [E. coli] as the cause of diseases classified elsewhere: Secondary | ICD-10-CM | POA: Diagnosis present

## 2022-02-13 LAB — CBC WITH DIFFERENTIAL/PLATELET
Abs Immature Granulocytes: 0.12 10*3/uL — ABNORMAL HIGH (ref 0.00–0.07)
Basophils Absolute: 0.1 10*3/uL (ref 0.0–0.1)
Basophils Relative: 0 %
Eosinophils Absolute: 0.2 10*3/uL (ref 0.0–0.5)
Eosinophils Relative: 1 %
HCT: 44.4 % (ref 36.0–46.0)
Hemoglobin: 14.2 g/dL (ref 12.0–15.0)
Immature Granulocytes: 1 %
Lymphocytes Relative: 18 %
Lymphs Abs: 2.4 10*3/uL (ref 0.7–4.0)
MCH: 28.5 pg (ref 26.0–34.0)
MCHC: 32 g/dL (ref 30.0–36.0)
MCV: 89.2 fL (ref 80.0–100.0)
Monocytes Absolute: 0.6 10*3/uL (ref 0.1–1.0)
Monocytes Relative: 5 %
Neutro Abs: 9.9 10*3/uL — ABNORMAL HIGH (ref 1.7–7.7)
Neutrophils Relative %: 75 %
Platelets: 340 10*3/uL (ref 150–400)
RBC: 4.98 MIL/uL (ref 3.87–5.11)
RDW: 14.5 % (ref 11.5–15.5)
WBC: 13.3 10*3/uL — ABNORMAL HIGH (ref 4.0–10.5)
nRBC: 0 % (ref 0.0–0.2)

## 2022-02-13 LAB — BASIC METABOLIC PANEL
Anion gap: 10 (ref 5–15)
BUN: 8 mg/dL (ref 6–20)
CO2: 25 mmol/L (ref 22–32)
Calcium: 8.2 mg/dL — ABNORMAL LOW (ref 8.9–10.3)
Chloride: 109 mmol/L (ref 98–111)
Creatinine, Ser: 0.81 mg/dL (ref 0.44–1.00)
GFR, Estimated: >60 mL/min (ref >60)
Glucose, Bld: 166 mg/dL — ABNORMAL HIGH (ref 70–99)
Potassium: 2.8 mmol/L — ABNORMAL LOW (ref 3.5–5.1)
Sodium: 144 mmol/L (ref 135–145)

## 2022-02-13 LAB — CULTURE, BLOOD (ROUTINE X 2)
Culture: NO GROWTH
Special Requests: ADEQUATE

## 2022-02-13 LAB — MAGNESIUM: Magnesium: 1.8 mg/dL (ref 1.7–2.4)

## 2022-02-13 MED ORDER — ACETAMINOPHEN 325 MG PO TABS
650.0000 mg | ORAL_TABLET | Freq: Four times a day (QID) | ORAL | Status: DC | PRN
  Administered 2022-02-15: 650 mg via ORAL
  Filled 2022-02-13: qty 2

## 2022-02-13 MED ORDER — ONDANSETRON HCL 4 MG/2ML IJ SOLN: 4.0000 mg | Freq: Four times a day (QID) | INTRAMUSCULAR | Status: DC | PRN

## 2022-02-13 MED ORDER — ACETAMINOPHEN 650 MG RE SUPP: 650.0000 mg | Freq: Four times a day (QID) | RECTAL | Status: DC | PRN

## 2022-02-13 MED ORDER — POTASSIUM CHLORIDE CRYS ER 20 MEQ PO TBCR: 40.0000 meq | EXTENDED_RELEASE_TABLET | Freq: Three times a day (TID) | ORAL | Status: DC

## 2022-02-13 MED ORDER — SODIUM CHLORIDE 0.9 % IV SOLN
1.0000 g | Freq: Three times a day (TID) | INTRAVENOUS | Status: DC
  Administered 2022-02-13: 1 g via INTRAVENOUS
  Filled 2022-02-13: qty 20

## 2022-02-13 MED ORDER — ONDANSETRON HCL 4 MG PO TABS: 4.0000 mg | ORAL_TABLET | Freq: Four times a day (QID) | ORAL | Status: DC | PRN

## 2022-02-13 MED ORDER — POTASSIUM CHLORIDE CRYS ER 20 MEQ PO TBCR
40.0000 meq | EXTENDED_RELEASE_TABLET | Freq: Three times a day (TID) | ORAL | Status: AC
  Administered 2022-02-13 (×2): 40 meq via ORAL
  Filled 2022-02-13 (×2): qty 2

## 2022-02-13 MED ORDER — TAMSULOSIN HCL 0.4 MG PO CAPS
0.4000 mg | ORAL_CAPSULE | Freq: Every day | ORAL | Status: DC
  Administered 2022-02-14 – 2022-02-16 (×3): 0.4 mg via ORAL
  Filled 2022-02-13 (×4): qty 1

## 2022-02-13 MED ORDER — SODIUM CHLORIDE 0.9 % IV SOLN
1.0000 g | Freq: Three times a day (TID) | INTRAVENOUS | Status: AC
  Administered 2022-02-13 – 2022-02-14 (×4): 1 g via INTRAVENOUS
  Filled 2022-02-13 (×2): qty 20
  Filled 2022-02-13: qty 1
  Filled 2022-02-13: qty 20

## 2022-02-13 MED ORDER — OXYBUTYNIN CHLORIDE 5 MG PO TABS
5.0000 mg | ORAL_TABLET | Freq: Three times a day (TID) | ORAL | Status: DC | PRN
  Administered 2022-02-14 – 2022-02-15 (×2): 5 mg via ORAL
  Filled 2022-02-13 (×2): qty 1

## 2022-02-13 MED ORDER — SODIUM CHLORIDE 0.9 % IV SOLN: INTRAVENOUS | Status: AC

## 2022-02-13 MED ORDER — ENOXAPARIN SODIUM 40 MG/0.4ML IJ SOSY
40.0000 mg | PREFILLED_SYRINGE | INTRAMUSCULAR | Status: DC
  Filled 2022-02-13: qty 0.4

## 2022-02-13 NOTE — ED Provider Notes (Signed)
[facility] Provider Note    Event Date/Time   First MD Initiated Contact with Patient 02/13/22 1438     (approximate)   History   Abnormal Lab (+ blood culture )   HPI  Martha Welch is a 56 y.o. female with a history of kidney stones who presents with dysuria, flank pain, and concern for ESBL UTI.  I reviewed the past medical records.  The patient was just admitted last week with similar symptoms and was found to have UTI and positive blood culture for ESBL E. coli.  She also had a kidney stone that was stented.  And discharged on 10/28 on oral antibiotics.  However after discharge she continued to be symptomatic and was recommended to return to the hospital for IV antibiotics.  The patient denies any fever.  She reports some lightheadedness today.  She has no vomiting or diarrhea.    Physical Exam   Triage Vital Signs: ED Triage Vitals  Enc Vitals Group     BP 02/13/22 1418 138/74     Pulse Rate 02/13/22 1418 (!) 104     Resp 02/13/22 1418 18     Temp 02/13/22 1418 98 F (36.7 C)     Temp src --      SpO2 02/13/22 1418 97 %     Weight --      Height --      Head Circumference --      Peak Flow --      Pain Score 02/13/22 1419 3     Pain Loc --      Pain Edu? --      Excl. in GC? --     Most recent vital signs: Vitals:   02/13/22 1418 02/13/22 1515  BP: 138/74 (!) 109/55  Pulse: (!) 104   Resp: 18   Temp: 98 F (36.7 C) 97.8 F (36.6 C)  SpO2: 97%      General: Awake, no distress.  CV:  Good peripheral perfusion.  Resp:  Normal effort.  Abd:  No distention.  Other:  Moist mucous membranes.  No peripheral edema.   ED Results / Procedures / Treatments   Labs (all labs ordered are listed, but only abnormal results are displayed) Labs Reviewed  BASIC METABOLIC PANEL - Abnormal; Notable for the following components:      Result Value   Potassium 2.8 (*)    Glucose, Bld 166 (*)    Calcium 8.2 (*)    All other components  within normal limits  CBC WITH DIFFERENTIAL/PLATELET - Abnormal; Notable for the following components:   WBC 13.3 (*)    Neutro Abs 9.9 (*)    Abs Immature Granulocytes 0.12 (*)    All other components within normal limits  MAGNESIUM     EKG     RADIOLOGY    PROCEDURES:  Critical Care performed: No  Procedures   MEDICATIONS ORDERED IN ED: Medications  potassium chloride SA (KLOR-CON M) CR tablet 40 mEq (has no administration in time range)  oxybutynin (DITROPAN) tablet 5 mg (has no administration in time range)  tamsulosin (FLOMAX) capsule 0.4 mg (has no administration in time range)  enoxaparin (LOVENOX) injection 40 mg (has no administration in time range)  0.9 %  sodium chloride infusion (has no administration in time range)  acetaminophen (TYLENOL) tablet 650 mg (has no administration in time range)    Or  acetaminophen (TYLENOL) suppository 650 mg (has no administration in time range)  ondansetron (ZOFRAN) tablet 4 mg (has no administration in time range)    Or  ondansetron (ZOFRAN) injection 4 mg (has no administration in time range)  meropenem (MERREM) 1 g in sodium chloride 0.9 % 100 mL IVPB (has no administration in time range)     IMPRESSION / MDM / ASSESSMENT AND PLAN / ED COURSE  I reviewed the triage vital signs and the nursing notes.  56 year old female recently treated for ESBL UTI was advised by the hospitalist to return for IV antibiotics.  The patient reports continued mild flank pain and some dysuria as well as feeling somewhat lightheaded today.  On exam her vital signs are normal except for borderline tachycardia.  Physical exam is otherwise unremarkable.  Differential diagnosis includes, but is not limited to, ESBL UTI, bacteremia, sepsis.  I consulted with Dr. [place] Pink from the hospitalist service who recommended starting her on meropenem.  Patient's presentation is most consistent with acute presentation with potential threat to life or  bodily function.  The patient is on the cardiac monitor to evaluate for evidence of arrhythmia and/or significant heart rate changes.  ----------------------------------------- 3:57 PM on 02/13/2022 -----------------------------------------  CBC shows mild leukocytosis.  BMP is normal except for borderline low potassium.  I consulted Dr. Francine Graven from the hospitalist service; based on her discussion she agrees to admit the patient.   FINAL CLINICAL IMPRESSION(S) / ED DIAGNOSES   Final diagnoses:  ESBL (extended spectrum beta-lactamase) producing bacteria infection     Rx / DC Orders   ED Discharge Orders     None        Note:  This document was prepared using Dragon voice recognition software and may include unintentional dictation errors.    Arta Silence, MD 02/13/22 [phone]

## 2022-02-13 NOTE — Assessment & Plan Note (Signed)
Patient admitted to the hospital for acute pyelonephritis and noted to have ESBL E. coli bacteremia. Urine culture also yielded ESBL E. Coli Patient received 3 days of IV meropenem during her last hospitalization and will be placed on meropenem 1 g IV every 8 hours to complete a 7 to 10-day course of therapy.

## 2022-02-13 NOTE — Assessment & Plan Note (Signed)
Smoking cessation was discussed with patient in detail ?She declines a nicotine transdermal patch ?

## 2022-02-13 NOTE — H&P (Signed)
History and Physical    Patient: Martha Welch XTG:[id number] DOB: 1965-11-17 DOA: 02/13/2022 DOS: the patient was seen and examined on 02/13/2022 PCP: Pcp, No  Patient coming from: Home  Chief Complaint:  Chief Complaint  Patient presents with   Abnormal Lab    + blood culture    HPI: Martha Welch is a 56 y.o. female with medical history significant for nicotine dependence, nephrolithiasis, history of ESBL UTI in the past who was recently admitted to the hospital for acute pyelonephritis.  Patient had a renal stone CT which showed a 4 mm left distal ureteral calculus with minimal hydronephrosis status post cystoscopy which was done on 10/27 with ureteral stent placement.  Blood and urine culture during the hospitalization from 10/26 yielded ESBL E. Coli and repeat blood culture is sterile  Patient was discharged home on 10/28 and asked to return to the hospital for IV antibiotic therapy. She continues to have dysuria but denies having any hematuria.  She has not had any fever or chills and denies having any flank pain. She denies having any fever, no chills, no cough, no dizziness, no lightheadedness, no changes in her bowel habits, no headache, no blurred vision, no focal deficit. Labs show a potassium of 2.8, white count of 13.3 Patient will be admitted to the hospital for IV antibiotic therapy.   Review of Systems: As mentioned in the history of present illness. All other systems reviewed and are negative. Past Medical History:  Diagnosis Date   Anemia    Past Surgical History:  Procedure Laterality Date   ABDOMINAL HYSTERECTOMY     CESAREAN SECTION     CHOLECYSTECTOMY     CYSTOSCOPY WITH STENT PLACEMENT Left 02/09/2022   Procedure: CYSTOSCOPY WITH STENT PLACEMENT;  Surgeon: Riki Altes, MD;  Location: [facility];  Service: Urology;  Laterality: Left;   CYSTOSCOPY/URETEROSCOPY/HOLMIUM LASER/STENT PLACEMENT Left 02/09/2022   Procedure: CYSTOSCOPY/URETEROSCOPY/HOLMIUM  LASER/STENT PLACEMENT;  Surgeon: Riki Altes, MD;  Location: [facility];  Service: Urology;  Laterality: Left;   Social History:  reports that she has been smoking. She has been smoking an average of 1.5 packs per day. She has never used smokeless tobacco. She reports current alcohol use. She reports that she does not use drugs.  Allergies  Allergen Reactions   Amoxicillin Other (See Comments)    Yeast infection    History reviewed. No pertinent family history.  Prior to Admission medications   Medication Sig Start Date End Date Taking? Authorizing Provider  amoxicillin-clavulanate (AUGMENTIN) 875-125 MG tablet Take 1 tablet by mouth 2 (two) times daily for 9 days. 02/10/22 02/19/22 Yes Hollice Espy, MD  fluconazole (DIFLUCAN) 150 MG tablet Take 1 dose now and then 1 dose after completion of antibiotics 02/10/22  Yes Hollice Espy, MD  tamsulosin (FLOMAX) 0.4 MG CAPS capsule Take 1 capsule (0.4 mg total) by mouth daily for 14 days. 02/11/22 02/25/22 Yes Hollice Espy, MD  oxybutynin (DITROPAN) 5 MG tablet Take 1 tablet (5 mg total) by mouth every 8 (eight) hours as needed for bladder spasms (Urinary frequency, urgency, spasm). 02/10/22   Hollice Espy, MD    Physical Exam: Vitals:   02/13/22 1418 02/13/22 1515  BP: 138/74 (!) 109/55  Pulse: (!) 104   Resp: 18   Temp: 98 F (36.7 C) 97.8 F (36.6 C)  TempSrc:  Oral  SpO2: 97%    Physical Exam Vitals and nursing note reviewed.  Constitutional:  Appearance: Normal appearance.  HENT:     Head: Normocephalic and atraumatic.     Nose: Nose normal.     Mouth/Throat:     Mouth: Mucous membranes are moist.  Eyes:     Conjunctiva/sclera: Conjunctivae normal.  Cardiovascular:     Rate and Rhythm: Tachycardia present.  Pulmonary:     Effort: Pulmonary effort is normal.     Breath sounds: Normal breath sounds.  Abdominal:     General: Abdomen is flat. Bowel sounds are normal.     Palpations: Abdomen is  soft.     Comments: No CVA tenderness  Musculoskeletal:        General: Normal range of motion.     Cervical back: Normal range of motion and neck supple.  Skin:    General: Skin is warm and dry.  Neurological:     General: No focal deficit present.     Mental Status: She is alert and oriented to person, place, and time.  Psychiatric:        Mood and Affect: Mood normal.        Behavior: Behavior normal.     Data Reviewed: Relevant notes from primary care and specialist visits, past discharge summaries as available in EHR, including Care Everywhere. Prior diagnostic testing as pertinent to current admission diagnoses Updated medications and problem lists for reconciliation ED course, including vitals, labs, imaging, treatment and response to treatment Triage notes, nursing and pharmacy notes and ED provider's notes Notable results as noted in HPI Labs reviewed.  Sodium 144, potassium 2.8, chloride 109, bicarb 25, glucose 166, BUN 8, creatinine 0.81, calcium 8.2, white count 13.3, hemoglobin 14.2, hematocrit 44.4, platelet count 340 Urine culture yielded greater than 100,000 ESBL E. coli There are no new results to review at this time.  Assessment and Plan: * Pyelonephritis Patient admitted to the hospital for acute pyelonephritis and noted to have ESBL E. coli bacteremia. Urine culture also yielded ESBL E. Coli Patient received 3 days of IV meropenem during her last hospitalization and will be placed on meropenem 1 g IV every 8 hours to complete a 7 to 10-day course of therapy.  Hypokalemia Supplement potassium Check magnesium levels  Hydronephrosis with obstructing calculus Patient was noted to have mild left-sided hydronephrosis during her last hospitalization and is status post cystoscopy with ureteral stent placement. Per urology indwelling ureteral stent for 7 to 10 days (stent was placed 10/27) May need to consult urology while in house for stent removal Continue Flomax  and Ditropan  Nicotine dependence Smoking cessation was discussed with patient in detail She declines a nicotine transdermal patch      Advance Care Planning:   Code Status: Full Code   Consults: None  Family Communication: Greater than 50% of time was spent discussing patient's condition and plan of care with her at the bedside.  All questions and concerns have been addressed.  She verbalizes understanding and agrees with the plan.  Severity of Illness: The appropriate patient status for this patient is INPATIENT. Inpatient status is judged to be reasonable and necessary in order to provide the required intensity of service to ensure the patient's safety. The patient's presenting symptoms, physical exam findings, and initial radiographic and laboratory data in the context of their chronic comorbidities is felt to place them at high risk for further clinical deterioration. Furthermore, it is not anticipated that the patient will be medically stable for discharge from the hospital within 2 midnights of admission.   *  I certify that at the point of admission it is my clinical judgment that the patient will require inpatient hospital care spanning beyond 2 midnights from the point of admission due to high intensity of service, high risk for further deterioration and high frequency of surveillance required.*  Author: Lucile Shutters, MD 02/13/2022 3:40 PM  For on call review [email or web address].

## 2022-02-13 NOTE — Assessment & Plan Note (Signed)
Supplement potassium Check magnesium levels 

## 2022-02-13 NOTE — Consult Note (Signed)
Pharmacy Antibiotic Note  Martha Welch is a 56 y.o. female admitted on 02/13/2022 with  pyelonephritis with hx of ESBL infection .  Pharmacy has been consulted for meropenem dosing.  -10/26: Blood cx 1 of 4 bottles: GNR,  BCID: E.coli ESBL (see image below)   Plan: Scr improved 1.06>>0.87>0.81. Pt left to home accounting for 2day interruption in therapy.  Will plan for standard 7days of therapy and reassess clinical response on day 5 to determine if further extension necessary.  Resume meropenem 1gm IV Q8hrs  x7d (10/31-11/07)   Temp (24hrs), Avg:97.9 F (36.6 C), Min:97.8 F (36.6 C), Max:98 F (36.7 C)  Recent Labs  Lab 02/08/22 1026 02/08/22 1327 02/09/22 0431 02/10/22 0502 02/13/22 1440  WBC 18.3*  --  13.7* 8.7 13.3*  CREATININE 1.06*  --  0.87 0.77 0.81  LATICACIDVEN  --  1.4  --   --   --      Estimated Creatinine Clearance: 64.2 mL/min (by C-G formula based on SCr of 0.81 mg/dL).    Allergies  Allergen Reactions   Amoxicillin Other (See Comments)    Yeast infection    Antimicrobials this admission: Ceftriaxone 10/26 >> 10/26 Meropenem 10/26 >> 10/28; 10/31-11/07   Dose adjustments this admission: Stable renal on current admit; CTM.  Microbiology results: **No new cultures collected yet. See below for prior cultures.   Thank you for allowing pharmacy to be a part of this patient's care.  Lorna Dibble, PharmD, [facility] Clinical Pharmacist 02/13/2022 4:00 PM

## 2022-02-13 NOTE — Assessment & Plan Note (Addendum)
Patient was noted to have mild left-sided hydronephrosis during her last hospitalization and is status post cystoscopy with ureteral stent placement. Per urology indwelling ureteral stent for 7 to 10 days (stent was placed 10/27) May need to consult urology while in house for stent removal Continue Flomax and Ditropan  Some hematuria which looks to be secondary to stent irritation.  Patient has plan to follow-up with urology on 11/8.

## 2022-02-13 NOTE — ED Triage Notes (Signed)
Pt to ED via POV from home. Pt was called by MD last night after positive blood culture for extensive IV antibiotic treatment.   Pt reports ongoing pain/burning with urination after stent placed for kidney stone. Pt has been on amoxicillin and flomax.

## 2022-02-14 ENCOUNTER — Inpatient Hospital Stay: Payer: Self-pay

## 2022-02-14 DIAGNOSIS — Z1612 Extended spectrum beta lactamase (ESBL) resistance: Secondary | ICD-10-CM

## 2022-02-14 DIAGNOSIS — B9629 Other Escherichia coli [E. coli] as the cause of diseases classified elsewhere: Secondary | ICD-10-CM

## 2022-02-14 DIAGNOSIS — E870 Hyperosmolality and hypernatremia: Secondary | ICD-10-CM

## 2022-02-14 DIAGNOSIS — N390 Urinary tract infection, site not specified: Secondary | ICD-10-CM

## 2022-02-14 DIAGNOSIS — A498 Other bacterial infections of unspecified site: Secondary | ICD-10-CM

## 2022-02-14 DIAGNOSIS — E876 Hypokalemia: Secondary | ICD-10-CM

## 2022-02-14 DIAGNOSIS — F17210 Nicotine dependence, cigarettes, uncomplicated: Secondary | ICD-10-CM

## 2022-02-14 DIAGNOSIS — B9620 Unspecified Escherichia coli [E. coli] as the cause of diseases classified elsewhere: Secondary | ICD-10-CM

## 2022-02-14 LAB — CBC
HCT: 43 % (ref 36.0–46.0)
Hemoglobin: 13.5 g/dL (ref 12.0–15.0)
MCH: 28.2 pg (ref 26.0–34.0)
MCHC: 31.4 g/dL (ref 30.0–36.0)
MCV: 89.8 fL (ref 80.0–100.0)
Platelets: 324 10*3/uL (ref 150–400)
RBC: 4.79 MIL/uL (ref 3.87–5.11)
RDW: 14.8 % (ref 11.5–15.5)
WBC: 13.1 10*3/uL — ABNORMAL HIGH (ref 4.0–10.5)
nRBC: 0 % (ref 0.0–0.2)

## 2022-02-14 LAB — BASIC METABOLIC PANEL
Anion gap: 5 (ref 5–15)
BUN: 8 mg/dL (ref 6–20)
CO2: 27 mmol/L (ref 22–32)
Calcium: 8.2 mg/dL — ABNORMAL LOW (ref 8.9–10.3)
Chloride: 114 mmol/L — ABNORMAL HIGH (ref 98–111)
Creatinine, Ser: 0.75 mg/dL (ref 0.44–1.00)
GFR, Estimated: >60 mL/min (ref >60)
Glucose, Bld: 81 mg/dL (ref 70–99)
Potassium: 3.7 mmol/L (ref 3.5–5.1)
Sodium: 146 mmol/L — ABNORMAL HIGH (ref 135–145)

## 2022-02-14 MED ORDER — DIPHENHYDRAMINE HCL 25 MG PO CAPS
25.0000 mg | ORAL_CAPSULE | Freq: Every evening | ORAL | Status: DC | PRN
  Administered 2022-02-14: 25 mg via ORAL
  Filled 2022-02-14: qty 1

## 2022-02-14 MED ORDER — SODIUM CHLORIDE 0.9 % IV SOLN: INTRAVENOUS | Status: DC

## 2022-02-14 MED ORDER — MELATONIN 5 MG PO TABS
5.0000 mg | ORAL_TABLET | Freq: Once | ORAL | Status: AC
  Administered 2022-02-15: 5 mg via ORAL
  Filled 2022-02-14 (×2): qty 1

## 2022-02-14 MED ORDER — SODIUM CHLORIDE 0.9 % IV SOLN
1.0000 g | INTRAVENOUS | Status: DC
  Administered 2022-02-15 – 2022-02-16 (×2): 1000 mg via INTRAVENOUS
  Filled 2022-02-14 (×2): qty 1

## 2022-02-14 NOTE — Consult Note (Signed)
NAME: Martha Welch  DOB: [birth year]  MRN: [id number]  Date/Time: 02/14/2022 3:44 PM  REQUESTING PROVIDER:Krishnan Subjective:  REASON FOR CONSULT: ESBL e.coli bacteremia ?family at bed side and sheconsented to be interviewed and examined in front of them Martha Welch is a 56 y.o. female with a history of renal stone is here in the hospital after being called back for ESBL e.coli bacteremia due to complicated UTI Pt was recently in the hospital 10/26-10/28/23 for hematuria, renal stonesever b/l back pain left > rt.  Ct renal showed obstructing 4 mm stone in the distal left ureter with minimal hydronephrosis and extensive b/l perinephric stranding. Urology took her for cystoscopy , lithotripsy with Holmium laser and stent placement- UC and BC esbl ecoli . After 2 days of Iv she was sent home on PO augmentin on 02/10/22 which does not work for ESBL . So she was called back and came to the ED on 02/13/22 She has been started on meropenem I am asked to see her for antibiotic management Pt says she has bloody urine which started today No fever or flank pain   Past Medical History:  Diagnosis Date   Anemia    Renal stones Past Surgical History:  Procedure Laterality Date   ABDOMINAL HYSTERECTOMY     CESAREAN SECTION     CHOLECYSTECTOMY     CYSTOSCOPY WITH STENT PLACEMENT Left 02/09/2022   Procedure: CYSTOSCOPY WITH STENT PLACEMENT;  Surgeon: Riki Altes, MD;  Location: [facility] ORS;  Service: Urology;  Laterality: Left;   CYSTOSCOPY/URETEROSCOPY/HOLMIUM LASER/STENT PLACEMENT Left 02/09/2022   Procedure: CYSTOSCOPY/URETEROSCOPY/HOLMIUM LASER/STENT PLACEMENT;  Surgeon: Riki Altes, MD;  Location: [facility] ORS;  Service: Urology;  Laterality: Left;    Social History   Socioeconomic History   Marital status: Married    Spouse name: Not on file   Number of children: Not on file   Years of education: Not on file   Highest education level: Not on file  Occupational History   Not on  file  Tobacco Use   Smoking status: Every Day    Packs/day: 1.50    Types: Cigarettes   Smokeless tobacco: Never  Vaping Use   Vaping Use: Never used  Substance and Sexual Activity   Alcohol use: Yes    Comment: socially    Drug use: No   Sexual activity: Not on file  Other Topics Concern   Not on file  Social History Narrative   Not on file   Social Determinants of Health   Financial Resource Strain: Not on file  Food Insecurity: No Food Insecurity (02/08/2022)   Hunger Vital Sign    Worried About Running Out of Food in the Last Year: Never true    Ran Out of Food in the Last Year: Never true  Transportation Needs: No Transportation Needs (02/08/2022)   PRAPARE - Administrator, Civil Service (Medical): No    Lack of Transportation (Non-Medical): No  Physical Activity: Not on file  Stress: Not on file  Social Connections: Not on file  Intimate Partner Violence: Not At Risk (02/08/2022)   Humiliation, Afraid, Rape, and Kick questionnaire    Fear of Current or Ex-Partner: No    Emotionally Abused: No    Physically Abused: No    Sexually Abused: No    History reviewed. No pertinent family history. Allergies  Allergen Reactions   Amoxicillin Other (See Comments)    Yeast infection   I? Current Facility-Administered Medications  Medication Dose  Route Frequency Provider Last Rate Last Admin   0.9 %  sodium chloride infusion   Intravenous Continuous Hollice EspyKrishnan, Sendil K, MD 50 mL/hr at 02/14/22 1000 New Bag at 02/14/22 1000   acetaminophen (TYLENOL) tablet 650 mg  650 mg Oral Q6H PRN Agbata, Tochukwu, MD       Or   acetaminophen (TYLENOL) suppository 650 mg  650 mg Rectal Q6H PRN Agbata, Tochukwu, MD       diphenhydrAMINE (BENADRYL) capsule 25 mg  25 mg Oral QHS PRN Hollice EspyKrishnan, Sendil K, MD       enoxaparin (LOVENOX) injection 40 mg  40 mg Subcutaneous Q24H Agbata, Tochukwu, MD       meropenem (MERREM) 1 g in sodium chloride 0.9 % 100 mL IVPB  1 g Intravenous  Q8H Beers, Brandon D, RPH 200 mL/hr at 02/14/22 1509 1 g at 02/14/22 1509   ondansetron (ZOFRAN) tablet 4 mg  4 mg Oral Q6H PRN Agbata, Tochukwu, MD       Or   ondansetron (ZOFRAN) injection 4 mg  4 mg Intravenous Q6H PRN Agbata, Tochukwu, MD       oxybutynin (DITROPAN) tablet 5 mg  5 mg Oral Q8H PRN Agbata, Tochukwu, MD       tamsulosin (FLOMAX) capsule 0.4 mg  0.4 mg Oral Daily Agbata, Tochukwu, MD   0.4 mg at 02/14/22 [id number]     Abtx:  Anti-infectives (From admission, onward)    Start     Dose/Rate Route Frequency Ordered Stop   02/13/22 2200  meropenem (MERREM) 1 g in sodium chloride 0.9 % 100 mL IVPB       Note to Pharmacy: Reassess at day 5   1 g 200 mL/hr over 30 Minutes Intravenous Every 8 hours 02/13/22 1533 02/20/22 1359   02/13/22 1415  meropenem (MERREM) 1 g in sodium chloride 0.9 % 100 mL IVPB  Status:  Discontinued        1 g 200 mL/hr over 30 Minutes Intravenous Every 8 hours 02/13/22 1400 02/13/22 1533       REVIEW OF SYSTEMS:  Const: negative fever, negative chills, negative weight loss Eyes: negative diplopia or visual changes, negative eye pain ENT: negative coryza, negative sore throat Resp: negative cough, hemoptysis, dyspnea Cards: negative for chest pain, palpitations, lower extremity edema GU: nas above GI: Negative for abdominal pain, diarrhea, bleeding, constipation Skin: negative for rash and pruritus Heme: negative for easy bruising and gum/nose bleeding MS: negative for myalgias, arthralgias, back pain and muscle weakness Neurolo:negative for headaches, dizziness, vertigo, memory problems  Psych: negative for feelings of anxiety, depression  Endocrine: negative for thyroid, diabetes Allergy/Immunology- none Objective:  VITALS:  BP (!) 142/74 (BP Location: Left Arm)   Pulse 76   Temp 98 F (36.7 C) (Oral)   Resp 16   SpO2 98%   PHYSICAL EXAM:  General: Alert, cooperative, no distress, appears stated age.  Head: Normocephalic, without obvious  abnormality, atraumatic. Eyes: Conjunctivae clear, anicteric sclerae. Pupils are equal ENT Nares normal. No drainage or sinus tenderness. Lips, mucosa, and tongue normal. No Thrush Neck: Supple, symmetrical, no adenopathy, thyroid: non tender no carotid bruit and no JVD. Back: No CVA tenderness. Lungs: Clear to auscultation bilaterally. No Wheezing or Rhonchi. No rales. Heart: Regular rate and rhythm, no murmur, rub or gallop. Abdomen: Soft, non-tender,not distended. Bowel sounds normal. No masses Extremities: atraumatic, no cyanosis. No edema. No clubbing Skin: No rashes or lesions. Or bruising Lymph: Cervical, supraclavicular normal. Neurologic: Grossly non-focal Pertinent Labs  Lab Results CBC    Component Value Date/Time   WBC 13.1 (H) 02/14/2022 0622   RBC 4.79 02/14/2022 0622   HGB 13.5 02/14/2022 0622   HCT 43.0 02/14/2022 0622   PLT 324 02/14/2022 0622   MCV 89.8 02/14/2022 0622   MCH 28.2 02/14/2022 0622   MCHC 31.4 02/14/2022 0622   RDW 14.8 02/14/2022 0622   LYMPHSABS 2.4 02/13/2022 1440   MONOABS 0.6 02/13/2022 1440   EOSABS 0.2 02/13/2022 1440   BASOSABS 0.1 02/13/2022 1440       Latest Ref Rng & Units 02/14/2022    6:22 AM 02/13/2022    2:40 PM 02/10/2022    5:02 AM  CMP  Glucose 70 - 99 mg/dL 81  166  217   BUN 6 - 20 mg/dL 8  8  20    Creatinine 0.44 - 1.00 mg/dL 0.75  0.81  0.77   Sodium 135 - 145 mmol/L 146  144  141   Potassium 3.5 - 5.1 mmol/L 3.7  2.8  3.2   Chloride 98 - 111 mmol/L 114  109  111   CO2 22 - 32 mmol/L 27  25  23    Calcium 8.9 - 10.3 mg/dL 8.2  8.2  8.2       Microbiology: Recent Results (from the past 240 hour(s))  Urine Culture     Status: Abnormal   Collection Time: 02/08/22 10:26 AM   Specimen: Urine, Random  Result Value Ref Range Status   Specimen Description   Final    URINE, RANDOM Performed at [facility], [address] [place], [place] [address]    Special Requests   Final    NONE Performed at  [facility], [address] [place], [place] [address]    Culture (A)  Final    >=100,000 COLONIES/mL ESCHERICHIA COLI Confirmed Extended Spectrum Beta-Lactamase Producer (ESBL).  In bloodstream infections from ESBL organisms, carbapenems are preferred over piperacillin/tazobactam. They are shown to have a lower risk of mortality.    Report Status 02/12/2022 FINAL  Final   Organism ID, Bacteria ESCHERICHIA COLI (A)  Final      Susceptibility   Escherichia coli - MIC*    AMPICILLIN >=32 RESISTANT Resistant     CEFAZOLIN >=64 RESISTANT Resistant     CEFEPIME 2 SENSITIVE Sensitive     CEFTRIAXONE >=64 RESISTANT Resistant     CIPROFLOXACIN >=4 RESISTANT Resistant     GENTAMICIN <=1 SENSITIVE Sensitive     IMIPENEM <=0.25 SENSITIVE Sensitive     NITROFURANTOIN <=16 SENSITIVE Sensitive     TRIMETH/SULFA >=320 RESISTANT Resistant     AMPICILLIN/SULBACTAM 4 SENSITIVE Sensitive     PIP/TAZO <=4 SENSITIVE Sensitive     * >=100,000 COLONIES/mL ESCHERICHIA COLI  Blood culture (routine x 2)     Status: None   Collection Time: 02/08/22  1:27 PM   Specimen: BLOOD  Result Value Ref Range Status   Specimen Description BLOOD BLOOD RIGHT FOREARM  Final   Special Requests   Final    BOTTLES DRAWN AEROBIC AND ANAEROBIC Blood Culture adequate volume   Culture   Final    NO GROWTH 5 DAYS Performed at [facility], [address] [place], [place] [address]    Report Status 02/13/2022 FINAL  Final  Blood culture (routine x 2)     Status: Abnormal   Collection Time: 02/08/22  1:27 PM   Specimen: BLOOD  Result Value Ref Range Status   Specimen Description  Final    BLOOD LEFT ANTECUBITAL Performed at [facility], [address] [place], [place] [address]    Special Requests   Final    BOTTLES DRAWN AEROBIC AND ANAEROBIC Blood Culture adequate volume Performed at [facility], [address] [place], [place] [address]    Culture  Setup Time   Final     Organism ID to follow GRAM NEGATIVE RODS ANAEROBIC BOTTLE ONLY CRITICAL RESULT CALLED TO, READ BACK BY AND VERIFIED WITH: WALID NAZARI @0212  ON 02/09/22 SKL Performed at [facility], [address] [place], [place]    Culture (A)  Final    ESCHERICHIA COLI Confirmed Extended Spectrum Beta-Lactamase Producer (ESBL).  In bloodstream infections from ESBL organisms, carbapenems are preferred over piperacillin/tazobactam. They are shown to have a lower risk of mortality.    Report Status 02/11/2022 FINAL  Final   Organism ID, Bacteria ESCHERICHIA COLI  Final      Susceptibility   Escherichia coli - MIC*    AMPICILLIN >=32 RESISTANT Resistant     CEFAZOLIN >=64 RESISTANT Resistant     CEFEPIME 16 RESISTANT Resistant     CEFTAZIDIME RESISTANT Resistant     CEFTRIAXONE >=64 RESISTANT Resistant     CIPROFLOXACIN >=4 RESISTANT Resistant     GENTAMICIN <=1 SENSITIVE Sensitive     IMIPENEM <=0.25 SENSITIVE Sensitive     TRIMETH/SULFA >=320 RESISTANT Resistant     AMPICILLIN/SULBACTAM 4 SENSITIVE Sensitive     PIP/TAZO <=4 SENSITIVE Sensitive     * ESCHERICHIA COLI  Blood Culture ID Panel (Reflexed)     Status: Abnormal   Collection Time: 02/08/22  1:27 PM  Result Value Ref Range Status   Enterococcus faecalis NOT DETECTED NOT DETECTED Final   Enterococcus Faecium NOT DETECTED NOT DETECTED Final   Listeria monocytogenes NOT DETECTED NOT DETECTED Final   Staphylococcus species NOT DETECTED NOT DETECTED Final   Staphylococcus aureus (BCID) NOT DETECTED NOT DETECTED Final   Staphylococcus epidermidis NOT DETECTED NOT DETECTED Final   Staphylococcus lugdunensis NOT DETECTED NOT DETECTED Final   Streptococcus species NOT DETECTED NOT DETECTED Final   Streptococcus agalactiae NOT DETECTED NOT DETECTED Final   Streptococcus pneumoniae NOT DETECTED NOT DETECTED Final   Streptococcus pyogenes NOT DETECTED NOT DETECTED Final   A.calcoaceticus-baumannii NOT DETECTED NOT DETECTED  Final   Bacteroides fragilis NOT DETECTED NOT DETECTED Final   Enterobacterales DETECTED (A) NOT DETECTED Final    Comment: Enterobacterales represent a large order of gram negative bacteria, not a single organism. CRITICAL RESULT CALLED TO, READ BACK BY AND VERIFIED WITH: WALID NAZARI @0212  ON 02/09/22 SKL    Enterobacter cloacae complex NOT DETECTED NOT DETECTED Final   Escherichia coli DETECTED (A) NOT DETECTED Final    Comment: CRITICAL RESULT CALLED TO, READ BACK BY AND VERIFIED WITH: WALID NAZARI @0212  ON 02/09/22 SKL    Klebsiella aerogenes NOT DETECTED NOT DETECTED Final   Klebsiella oxytoca NOT DETECTED NOT DETECTED Final   Klebsiella pneumoniae NOT DETECTED NOT DETECTED Final   Proteus species NOT DETECTED NOT DETECTED Final   Salmonella species NOT DETECTED NOT DETECTED Final   Serratia marcescens NOT DETECTED NOT DETECTED Final   Haemophilus influenzae NOT DETECTED NOT DETECTED Final   Neisseria meningitidis NOT DETECTED NOT DETECTED Final   Pseudomonas aeruginosa NOT DETECTED NOT DETECTED Final   Stenotrophomonas maltophilia NOT DETECTED NOT DETECTED Final   Candida albicans NOT DETECTED NOT DETECTED Final   Candida auris NOT DETECTED NOT DETECTED Final  Candida glabrata NOT DETECTED NOT DETECTED Final   Candida krusei NOT DETECTED NOT DETECTED Final   Candida parapsilosis NOT DETECTED NOT DETECTED Final   Candida tropicalis NOT DETECTED NOT DETECTED Final   Cryptococcus neoformans/gattii NOT DETECTED NOT DETECTED Final   CTX-M ESBL DETECTED (A) NOT DETECTED Final    Comment: CRITICAL RESULT CALLED TO, READ BACK BY AND VERIFIED WITH: WALID NAZARI @0212  ON 02/09/22 SKL (NOTE) Extended spectrum beta-lactamase detected. Recommend a carbapenem as initial therapy.      Carbapenem resistance IMP NOT DETECTED NOT DETECTED Final   Carbapenem resistance KPC NOT DETECTED NOT DETECTED Final   Carbapenem resistance NDM NOT DETECTED NOT DETECTED Final   Carbapenem resist OXA  48 LIKE NOT DETECTED NOT DETECTED Final   Carbapenem resistance VIM NOT DETECTED NOT DETECTED Final    Comment: Performed at [facility], [address] [place], [place]  Urine Culture     Status: None   Collection Time: 02/09/22  4:00 PM   Specimen: PATH Cytology Urine  Result Value Ref Range Status   Specimen Description   Final    URINE, RANDOM Performed at [facility], [address], [place], [place]    Special Requests   Final    NONE Performed at [facility], [address] [place], [place]    Culture   Final    NO GROWTH Performed at [facility], [address], [place], [place]    Report Status 02/10/2022 FINAL  Final    IMAGING RESULTS: 02/08/22  I have personally reviewed the films ?CT scan from 02/08/22 showed 4 mm calculus in the distal left ureter causing mild hydronephrosis. There were left non obstructing renal stones present   Impression/Recommendation ?ESBL ecoli bacteremia with complicated UTI due to obstructing stone left PUJ with mild left hydronephrosis and b/l pyelonephritis s/p lithotripsy and stent placement C/o hematuria since today Will get 02/10/22 renal Need to get urology back on the case Change meropenem to ertapenem- would need 10-14 days of Iv antibiotic depending on resolution of hematuria Repeat Blood culture  before midline can be placed especially with leucocytosis and hematuria   ?Nicotine dependence ? ___________________________________________________ Discussed with patient  in detail  Note:  This document was prepared using Dragon voice recognition software and may include unintentional dictation errors.

## 2022-02-14 NOTE — TOC Initial Note (Signed)
Transition of Care [facility]) - Initial/Assessment Note    Patient Details  Name: Martha Welch MRN: [id number] Date of Birth: 04/06/1966  [facility] CM/SW Contact:    Beverly Sessions, RN Phone Number: 02/14/2022, 3:39 PM  Clinical Narrative:                    Admitted CBJ:SEGBTDV tract infection due to extended-spectrum beta lactamase (ESBL) producing Escherichia coli Admitted from: home PCP: No PCP.  Provided resources for PCP during admission last week    Patient is uninsured. Patient will require IV antibiotics at discharge.  Patient would like to proceed with [facility] health and infusion if she qualifies.  Daughter is a Camera operator and will also be available for education if needed  Referral to [facility] with [facility] for infusion Referral to [place] with [place] for [facility] health  MD has agreed to sign outpatient home health orders.  I have provided his contact information to [place] with [place]       Patient Goals and CMS Choice        Expected Discharge Plan and Services                                                Prior Living Arrangements/Services                       Activities of Daily Living      Permission Sought/Granted                  Emotional Assessment              Admission diagnosis:  Pyelonephritis [N12] ESBL (extended spectrum beta-lactamase) producing bacteria infection [A49.9, Z16.12] Patient Active Problem List   Diagnosis Date Noted   Urinary tract infection due to extended-spectrum beta lactamase (ESBL) producing Escherichia coli 02/14/2022   Hypernatremia 02/14/2022   Nicotine dependence 02/13/2022   Hydronephrosis with obstructing calculus 02/13/2022   Acute pyelonephritis 02/08/2022   Kidney stone 02/08/2022   AKI (acute kidney injury) ([place]) 02/08/2022   Hypokalemia 02/08/2022   Prediabetes 02/08/2022   PCP:  Merryl Hacker No Pharmacy:   [facility] [address], [place]  - [place] [address] Phone: [phone] Fax: [phone]     Social Determinants of Health (SDOH) Interventions    Readmission Risk Interventions     No data to display

## 2022-02-14 NOTE — Progress Notes (Signed)
Mobility Specialist - Progress Note   02/14/22 1433  Mobility  Activity Ambulated independently in hallway  Level of Assistance Independent  Assistive Device None (Pushing IV Pole)  Distance Ambulated (ft) 200 ft  Activity Response Tolerated well  Mobility Referral Yes  $Mobility charge 1 Mobility   Pt ambulated 200 ft in the hallway without AD, tolerated well. Pt returned to room, left ambulating around the room with needs within reach. No complaints.   Candie Mile Mobility Specialist 02/14/22 2:35 PM

## 2022-02-14 NOTE — Progress Notes (Signed)
End of shift note:   Pt was started on IVF and IV antibiotics. VSS.

## 2022-02-14 NOTE — Hospital Course (Addendum)
56 year old female with past medical history of tobacco abuse and renal stones who was just discharged from the hospital less than a week ago for acute pyelonephritis.  Patient underwent stent placement by urology.  Urine and blood cultures grew out E. coli, which ended up noting ESBL.  Given ESBL, ID recommended full 10-day course of IV antibiotics (patient had only received 3 days of IV meropenem).  As patient did not have insurance, arranges made for patient to be readmitted for remaining treatment.  Initially plan was for patient to stay in the hospital for IV antibiotics, however, case management able to arrange charity care so patient able to go home after midline placed and continue to get IV antibiotics at home.

## 2022-02-14 NOTE — Assessment & Plan Note (Signed)
Sodium of 146 on 11/1.  Likely from some early dehydration.  Will give dose of gentle IV fluids.

## 2022-02-14 NOTE — Progress Notes (Incomplete)
       CROSS COVER NOTE  NAME: AMANPREET DELMONT MRN: [id number] DOB : July 20, 1965 ATTENDING PHYSICIAN: Annita Brod, MD    Date of Service   02/14/2022   HPI/Events of Note   Medication request received for sleep aid. Pt received PRN benadryl around 8PM which was effective. She was woken up by lab and RN reports she is anxious and now unable to sleep.  0600: M(r)s Hett is reporting a 10/10 headache. She reports the headaches are not new and she normally gets them when she does not sleep well and takes [facility] for them at home.  Interventions   Assessment/Plan:  Melatonin  Headache Toradol x1 Consider imaging if headache persists      This document was prepared using Dragon voice recognition software and may include unintentional dictation errors.  Neomia Glass DNP, MBA, FNP-BC Nurse Practitioner [facility] Pager [phone]

## 2022-02-14 NOTE — Progress Notes (Signed)
Insert Midline ordered.  Spoke with Dr. [place] Pink via secured chat.  Awaiting to find out if pt can get home tx.  Primary RN to advise IVT when approved.

## 2022-02-14 NOTE — Progress Notes (Signed)
[facility] Progress Note  Patient: Martha Welch    BPZ:[id number]  DOA: 02/13/2022    Date of Service: the patient was seen and examined on 02/14/2022  Brief hospital course: 56 year old female with past medical history of tobacco abuse and renal stones who was just discharged from the hospital less than a week ago for acute pyelonephritis.  Patient underwent stent placement by urology.  Urine and blood cultures grew out E. coli, which ended up noting ESBL.  Given ESBL, ID recommended full 10-day course of IV antibiotics (patient had only received 3 days of IV meropenem).  As patient did not have insurance, arranges made for patient to be readmitted for remaining 7 days of treatment.  Assessment and Plan: Assessment and Plan: * Urinary tract infection due to extended-spectrum beta lactamase (ESBL) producing Escherichia coli Patient readmitted.  Started on IV meropenem and will need to complete 7 days.  Hydronephrosis with obstructing calculus Patient was noted to have mild left-sided hydronephrosis during her last hospitalization and is status post cystoscopy with ureteral stent placement. Per urology indwelling ureteral stent for 7 to 10 days (stent was placed 10/27) May need to consult urology while in house for stent removal Continue Flomax and Ditropan  Hypokalemia Supplement potassium Check magnesium levels  Hypernatremia Sodium of 146 on 11/1.  Likely from some early dehydration.  Will give dose of gentle IV fluids.  Nicotine dependence Smoking cessation was discussed with patient in detail She declines a nicotine transdermal patch       There is no height or weight on file to calculate BMI.        Consultants: None  Procedures: PICC line to be placed  Antimicrobials: IV meropenem 10/31-present  Code Status: Full code   Subjective: Patient doing okay, no complaints  Objective: Vital signs were reviewed and unremarkable. Vitals:   02/13/22 2032  02/14/22 0442  BP: (!) 143/76 (!) 148/67  Pulse: 81 75  Resp: 20 18  Temp: 98.2 F (36.8 C) 97.7 F (36.5 C)  SpO2: 97% 100%    Intake/Output Summary (Last 24 hours) at 02/14/2022 2423 Last data filed at 02/13/2022 2203 Gross per 24 hour  Intake 585.03 ml  Output --  Net 585.03 ml   There were no vitals filed for this visit. There is no height or weight on file to calculate BMI.  Exam:  General: Alert and oriented x3, no acute distress HEENT: Normal cephalic, atraumatic, mucous membranes are moist Cardiovascular: Giller rate and rhythm, S1-S2 Respiratory: Clear to auscultation bilaterally Abdomen: Soft, nontender, nondistended, positive bowel sounds Musculoskeletal: No cyanosis or edema noted Skin: No skin breaks, tears or lesions Psychiatry: Appropriate, no evidence of psychoses Neurology: No focal deficits  Data Reviewed: Noted sodium of 146.  Potassium normalized.  White blood cell count of 13.1.  Disposition:  Status is: Inpatient Remains inpatient appropriate because:  -Needs IV antibiotics for 1 week    Anticipated discharge date: 11/6, unless arrangements can be made for patient to go home with IV antibiotics  Family Communication: Will update husband DVT Prophylaxis: enoxaparin (LOVENOX) injection 40 mg Start: 02/13/22 1600    Author: Annita Brod ,MD 02/14/2022 8:14 AM  To reach On-call, see care teams to locate the attending and reach out via [email or web address]. Between 7PM-7AM, please contact night-coverage If you still have difficulty reaching the attending provider, please page the [facility] (Director on Call) for [facility] on amion for assistance.

## 2022-02-14 NOTE — Assessment & Plan Note (Addendum)
Patient readmitted.  Started on IV meropenem, then changed to ertapenem and will need to complete 7 days.  Case management looking into charity options where patient may be able to go home with IV antibiotics.  Second set of blood cultures done 11/1.  With no growth to date, midline placed 11/3 and patient discharged home on IV Invanz

## 2022-02-15 DIAGNOSIS — N132 Hydronephrosis with renal and ureteral calculous obstruction: Secondary | ICD-10-CM

## 2022-02-15 DIAGNOSIS — G479 Sleep disorder, unspecified: Secondary | ICD-10-CM

## 2022-02-15 LAB — BASIC METABOLIC PANEL
Anion gap: 7 (ref 5–15)
BUN: 9 mg/dL (ref 6–20)
CO2: 26 mmol/L (ref 22–32)
Calcium: 8 mg/dL — ABNORMAL LOW (ref 8.9–10.3)
Chloride: 111 mmol/L (ref 98–111)
Creatinine, Ser: 0.75 mg/dL (ref 0.44–1.00)
GFR, Estimated: >60 mL/min (ref >60)
Glucose, Bld: 93 mg/dL (ref 70–99)
Potassium: 3.2 mmol/L — ABNORMAL LOW (ref 3.5–5.1)
Sodium: 144 mmol/L (ref 135–145)

## 2022-02-15 LAB — CBC
HCT: 40.3 % (ref 36.0–46.0)
Hemoglobin: 12.7 g/dL (ref 12.0–15.0)
MCH: 28.7 pg (ref 26.0–34.0)
MCHC: 31.5 g/dL (ref 30.0–36.0)
MCV: 91 fL (ref 80.0–100.0)
Platelets: 305 10*3/uL (ref 150–400)
RBC: 4.43 MIL/uL (ref 3.87–5.11)
RDW: 14.6 % (ref 11.5–15.5)
WBC: 13.6 10*3/uL — ABNORMAL HIGH (ref 4.0–10.5)
nRBC: 0 % (ref 0.0–0.2)

## 2022-02-15 MED ORDER — POTASSIUM CHLORIDE CRYS ER 20 MEQ PO TBCR
40.0000 meq | EXTENDED_RELEASE_TABLET | Freq: Once | ORAL | Status: AC
  Administered 2022-02-15: 40 meq via ORAL
  Filled 2022-02-15: qty 2

## 2022-02-15 MED ORDER — ZOLPIDEM TARTRATE 5 MG PO TABS
5.0000 mg | ORAL_TABLET | Freq: Every evening | ORAL | Status: DC | PRN
  Administered 2022-02-15: 5 mg via ORAL
  Filled 2022-02-15: qty 1

## 2022-02-15 MED ORDER — KETOROLAC TROMETHAMINE 15 MG/ML IJ SOLN
15.0000 mg | Freq: Once | INTRAMUSCULAR | Status: AC
  Administered 2022-02-15: 15 mg via INTRAVENOUS
  Filled 2022-02-15: qty 1

## 2022-02-15 NOTE — Progress Notes (Signed)
[facility] Progress Note  Patient: Martha Welch    EXN:[id number]  DOA: 02/13/2022    Date of Service: the patient was seen and examined on 02/15/2022  Brief hospital course: 56 year old female with past medical history of tobacco abuse and renal stones who was just discharged from the hospital less than a week ago for acute pyelonephritis.  Patient underwent stent placement by urology.  Urine and blood cultures grew out E. coli, which ended up noting ESBL.  Given ESBL, ID recommended full 10-day course of IV antibiotics (patient had only received 3 days of IV meropenem).  As patient did not have insurance, arranges made for patient to be readmitted for remaining 7 days of treatment.  Assessment and Plan: Assessment and Plan: * Urinary tract infection due to extended-spectrum beta lactamase (ESBL) producing Escherichia coli Patient readmitted.  Started on IV meropenem, then changed to ertapenem and will need to complete 7 days.  Case management looking into charity options where patient may be able to go home with IV antibiotics.  Second set of blood cultures done 11/1.  If these cultures are negative, place midline on 11/3  Hydronephrosis with obstructing calculus Patient was noted to have mild left-sided hydronephrosis during her last hospitalization and is status post cystoscopy with ureteral stent placement. Per urology indwelling ureteral stent for 7 to 10 days (stent was placed 10/27) May need to consult urology while in house for stent removal Continue Flomax and Ditropan  Some hematuria which looks to be secondary to stent irritation.  Patient states hematuria is light  Hypokalemia Supplement potassium Check magnesium levels  Hypernatremia-resolved as of 02/15/2022 Sodium of 146 on 11/1.  Likely from some early dehydration.  Will give dose of gentle IV fluids.  Nicotine dependence Smoking cessation was discussed with patient in detail She declines a nicotine  transdermal patch       There is no height or weight on file to calculate BMI.        Consultants: Factious disease  Procedures: Midline to be placed  Antimicrobials: IV meropenem 10/31-11/2 IV ertapenem 11/2-present  Code Status: Full code   Subjective: Patient did not sleep well, complains of mild hematuria  Objective: Vital signs were reviewed and unremarkable. Vitals:   02/15/22 0547 02/15/22 0900  BP: 139/71 (!) 149/72  Pulse: 71 84  Resp: 20 18  Temp: 98.1 F (36.7 C) 97.9 F (36.6 C)  SpO2: 93% 98%    Intake/Output Summary (Last 24 hours) at 02/15/2022 1446 Last data filed at 02/15/2022 0646 Gross per 24 hour  Intake 995.69 ml  Output --  Net 995.69 ml    There were no vitals filed for this visit. There is no height or weight on file to calculate BMI.  Exam:  General: Alert and oriented x3, no acute distress HEENT: Normal cephalic, atraumatic, mucous membranes are moist Cardiovascular: Regular rate and rhythm, S1-S2 Respiratory: Clear to auscultation bilaterally Abdomen: Soft, nontender, nondistended, positive bowel sounds Musculoskeletal: No cyanosis or edema noted Skin: No skin breaks, tears or lesions Psychiatry: Appropriate, no evidence of psychoses Neurology: No focal deficits  Data Reviewed: Potassium 3.2.  White blood cell count of 13.6.  Disposition:  Status is: Inpatient Remains inpatient appropriate because:  -Clearance of blood cultures so midline can safely be placed    Anticipated discharge date: 11/6, unless arrangements can be made for patient to go home with IV antibiotics  Family Communication: Will update husband DVT Prophylaxis: enoxaparin (LOVENOX) injection 40 mg Start: 02/13/22 1600  Author: Hollice Espy ,MD 02/15/2022 2:46 PM  To reach On-call, see care teams to locate the attending and reach out via [email or web address]. Between 7PM-7AM, please contact night-coverage If you still have difficulty reaching  the attending provider, please page the [facility] (Director on Call) for [facility] on amion for assistance.

## 2022-02-15 NOTE — Progress Notes (Signed)
Secure chat with dr [place] Pink re midline.  Ok to cancel order.

## 2022-02-15 NOTE — TOC Progression Note (Signed)
Transition of Care [facility]) - Progression Note    Patient Details  Name: Martha Welch MRN: [id number] Date of Birth: 1965/08/02  [facility] CM/SW Contact  Beverly Sessions, RN Phone Number: 02/15/2022, 10:55 AM  Clinical Narrative:      Per [place] with [facility] Patient's monthly income is to high and she does not qualify for charity home health services  At this time plan if for Pam with [facility] to provided education on home infusion, and Dustin ID pharmacist will arrange for midline to be pulled at same day surgery  MD and ID aware of plan       Expected Discharge Plan and Services                                                 Social Determinants of Health (SDOH) Interventions    Readmission Risk Interventions     No data to display

## 2022-02-15 NOTE — Progress Notes (Signed)
Mobility Specialist - Progress Note   02/15/22 1410  Mobility  Activity Ambulated independently in hallway  Level of Assistance Independent  Assistive Device None (Iv Pole Push)  Distance Ambulated (ft) 320 ft  Activity Response Tolerated well  $Mobility charge 1 Mobility   [facility]  Mobility Specialist  02/15/22 2:10 PM

## 2022-02-16 LAB — BASIC METABOLIC PANEL
Anion gap: 10 (ref 5–15)
BUN: 9 mg/dL (ref 6–20)
CO2: 26 mmol/L (ref 22–32)
Calcium: 8.4 mg/dL — ABNORMAL LOW (ref 8.9–10.3)
Chloride: 109 mmol/L (ref 98–111)
Creatinine, Ser: 0.74 mg/dL (ref 0.44–1.00)
GFR, Estimated: >60 mL/min (ref >60)
Glucose, Bld: 163 mg/dL — ABNORMAL HIGH (ref 70–99)
Potassium: 3.4 mmol/L — ABNORMAL LOW (ref 3.5–5.1)
Sodium: 145 mmol/L (ref 135–145)

## 2022-02-16 LAB — CALCULI, WITH PHOTOGRAPH (CLINICAL LAB)
Calcium Oxalate Dihydrate: 10 %
Calcium Oxalate Monohydrate: 90 %
Weight Calculi: 20 mg

## 2022-02-16 LAB — CBC
HCT: 39.3 % (ref 36.0–46.0)
Hemoglobin: 12.5 g/dL (ref 12.0–15.0)
MCH: 28.4 pg (ref 26.0–34.0)
MCHC: 31.8 g/dL (ref 30.0–36.0)
MCV: 89.3 fL (ref 80.0–100.0)
Platelets: 319 10*3/uL (ref 150–400)
RBC: 4.4 MIL/uL (ref 3.87–5.11)
RDW: 14.5 % (ref 11.5–15.5)
WBC: 13.9 10*3/uL — ABNORMAL HIGH (ref 4.0–10.5)
nRBC: 0 % (ref 0.0–0.2)

## 2022-02-16 MED ORDER — LIDOCAINE-PRILOCAINE 2.5-2.5 % EX CREA
TOPICAL_CREAM | CUTANEOUS | Status: AC
  Filled 2022-02-16: qty 5

## 2022-02-16 MED ORDER — SODIUM CHLORIDE 0.9% FLUSH: 10.0000 mL | INTRAVENOUS | Status: DC | PRN

## 2022-02-16 MED ORDER — ERTAPENEM IV (FOR PTA / DISCHARGE USE ONLY): 1.0000 g | INTRAVENOUS | 0 refills | Status: AC

## 2022-02-16 MED ORDER — SODIUM CHLORIDE 0.9% FLUSH: 10.0000 mL | Freq: Two times a day (BID) | INTRAVENOUS | Status: DC

## 2022-02-16 MED ORDER — ORAL CARE MOUTH RINSE: 15.0000 mL | OROMUCOSAL | Status: DC | PRN

## 2022-02-16 NOTE — Progress Notes (Signed)
   Date of Admission:  02/13/2022   Total days of antibiotics            ID: Martha Welch is a 56 y.o. female  Principal Problem:   Urinary tract infection due to extended-spectrum beta lactamase (ESBL) producing Escherichia coli Active Problems:   Hypokalemia   Nicotine dependence   Hydronephrosis with obstructing calculus    Subjective: Pt is doing fine Walking in the corridor Has hematuria No flank pain  Medications:   enoxaparin (LOVENOX) injection  40 mg Subcutaneous Q24H   tamsulosin  0.4 mg Oral Daily    Objective: Vital signs in last 24 hours: Temp:  [97.5 F (36.4 C)-98.8 F (37.1 C)] 97.5 F (36.4 C) (11/03 0821) Pulse Rate:  [62-70] 62 (11/03 0821) Resp:  [16-18] 18 (11/03 0821) BP: (141-149)/(71-80) 142/75 (11/03 0821) SpO2:  [97 %-100 %] 100 % (11/03 0821)    PHYSICAL EXAM:  General: Alert, cooperative, no distress, appears stated age.  Head: Normocephalic, without obvious abnormality, atraumatic. Eyes: Conjunctivae clear, anicteric sclerae. Pupils are equal ENT Nares normal. No drainage or sinus tenderness. Lips, mucosa, and tongue normal. No Thrush Neck: Supple, symmetrical, no adenopathy, thyroid: non tender no carotid bruit and no JVD. Back: No CVA tenderness. Lungs: Clear to auscultation bilaterally. No Wheezing or Rhonchi. No rales. Heart: Regular rate and rhythm, no murmur, rub or gallop. Abdomen: Soft, non-tender,not distended. Bowel sounds normal. No masses Extremities: atraumatic, no cyanosis. No edema. No clubbing Skin: No rashes or lesions. Or bruising Lymph: Cervical, supraclavicular normal. Neurologic: Grossly non-focal  Lab Results Recent Labs    02/14/22 0622 02/15/22 0536  WBC 13.1* 13.6*  HGB 13.5 12.7  HCT 43.0 40.3  NA 146* 144  K 3.7 3.2*  CL 114* 111  CO2 27 26  BUN 8 9  CREATININE 0.75 0.75     Microbiology: 11/1 BC NG. Studies/Results: US RENAL  Result Date: 02/14/2022 CLINICAL DATA:  Hydronephrosis.  EXAM: RENAL / URINARY TRACT ULTRASOUND COMPLETE COMPARISON:  Renal stone CT 02/08/2022 FINDINGS: Right Kidney: Renal measurements: 9.0 x 4.3 x 4.4 cm = volume: 89 mL. Echogenicity within normal limits. No mass or hydronephrosis visualized. Left Kidney: Renal measurements: 10.6 x 5.3 x 4.8 cm = volume: 140 mL. Echogenicity within normal limits. There is prominence of the left renal collecting system/trace left hydronephrosis which appears similar to prior. Shadowing calculi are identified measuring up to 4.2 mm. Bladder: Appears normal for degree of bladder distention. Bilateral ureteral jets are visualized. Stent or catheter is visualized in the bladder. Other: None. IMPRESSION: 1. Stable prominence of the left renal collecting system/trace left hydronephrosis. 2. Left-sided nephrolithiasis. 3. Stent or catheter is visualized in the bladder. Electronically Signed   By: Ronney Asters M.D.   On: 02/14/2022 17:47     Assessment/Plan:  ESBl ecoli bacteremia due to complicated UTI because of left PUJ stone  S/p lithotripsy and Stent on 02/11/22 Some hematuria now- discussed with Dr.Stoioff - due to the stent The stent will be removed next Wednesday She will complete IV antibiotic on 11/9 Thursday and midline will be removed  Discussed the management with patient and care team

## 2022-02-16 NOTE — Treatment Plan (Signed)
Diagnosis: ESBL e.coli bacteremia Complicated UTI Baseline Creatinine <1    Allergies  Allergen Reactions   Amoxicillin Other (See Comments)    Yeast infection    OPAT Orders Discharge antibiotics: Ertapenem 1 gram IV every 24 hours until 02/22/22 Duration: 10 days  MID line Care Per Protocol:  Labs weekly on Monday while on IV antibiotics: _X_ CBC with differential  _x_ CMP   __ Please pull PIC at completion of IV antibiotics on 11/9 or 02/23/22   Fax weekly lab results  promptly to [phone]  Clinic Follow Up Appt:with DrRavishankar PRN Follow up with urology   Call [phone] with any concerns or questions

## 2022-02-16 NOTE — TOC Progression Note (Addendum)
Transition of Care [facility]) - Progression Note    Patient Details  Name: Martha Welch MRN: [id number] Date of Birth: [birth year]  Transition of Care [facility]) CM/SW [place], LCSW Phone Number: 02/16/2022, 9:21 AM  Clinical Narrative:    CSW spoke with Pam with [facility]. Pam confirmed teaching has been done with patient and family member. Pam stated patient will just need midline placed, duration of antibiotics to be confirmed, and plan for line removal. Notified MD and Pharmacy.   10:26- Pharmacist Dustin made appointment for line removal 11/9 10:30 am at Same Day Surgery. Care Team aware.        Expected Discharge Plan and Services                                                 Social Determinants of Health (SDOH) Interventions    Readmission Risk Interventions     No data to display

## 2022-02-16 NOTE — Progress Notes (Signed)
A midline has been ordered for this patient. The [facility] RN has reviewed the patient's medical record including any arm restrictions, current creatinine clearance, length IV therapy is needed, and infusions needed/ordered to determine if a midline is the appropriate line for this individual patient. If there are contraindications, the physician and primary RN has been contacted by [facility] RN for further discussion. Midline Education: a midline is a long peripheral IV placed in the upper arm with the tip located at or near the axilla and distal to the shoulder should not be used as a CLABSI preventative measure   it has one lumen only it can remain in place for up to 29 days  Safe for Vancomycin infusion LESS THAN 6 days Is safe for power injection if good blood return can be obtained and line flushes easily it CANNOT be used for continuous infusion of vesicants. Including TPN and chemotherapy Should NOT be placed for sole intent of obtaining labs as there is no guarantee blood can be successfully drawn from line  contraindicated in patients with thrombosis, hypercoagulability, decreased venous flow to the extremities, ESRD (without a nephrologist's approval), small vessels, allergy to polyurethane, or known/suspected presence of a device-related infection, bacteremia, or septicemia    Patient is very anxious and was crying before and during midline placement. Apolonio Schneiders RN put lidocaine cream on patient per the request of patient before placing midline. LUA midline 18G x 8 cm placed successfully. RN aware.

## 2022-02-16 NOTE — Discharge Summary (Signed)
Physician Discharge Summary   Patient: Martha Welch MRN: [id number] DOB: [birth year]  Admit date:     02/13/2022  Discharge date: 02/16/22  Discharge Physician: Hollice Espy   PCP: Pcp, No   Recommendations at discharge:   Medication change: Augmentin discontinued New medication: IV Invanz 1 g daily from 11/4 - 11/9 Following completion of IV antibiotics, PICC line will be removed Patient will keep appointment with urology next week  Discharge Diagnoses: Principal Problem:   Urinary tract infection due to extended-spectrum beta lactamase (ESBL) producing Escherichia coli Active Problems:   Hydronephrosis with obstructing calculus   Hypokalemia   Nicotine dependence  Resolved Problems:   Hypernatremia  Hospital Course: 56 year old female with past medical history of tobacco abuse and renal stones who was just discharged from the hospital less than a week ago for acute pyelonephritis.  Patient underwent stent placement by urology.  Urine and blood cultures grew out E. coli, which ended up noting ESBL.  Given ESBL, ID recommended full 10-day course of IV antibiotics (patient had only received 3 days of IV meropenem).  As patient did not have insurance, arranges made for patient to be readmitted for remaining treatment.  Initially plan was for patient to stay in the hospital for IV antibiotics, however, case management able to arrange charity care so patient able to go home after midline placed and continue to get IV antibiotics at home.  Assessment and Plan: * Urinary tract infection due to extended-spectrum beta lactamase (ESBL) producing Escherichia coli Patient readmitted.  Started on IV meropenem, then changed to ertapenem and will need to complete 7 days.  Case management looking into charity options where patient may be able to go home with IV antibiotics.  Second set of blood cultures done 11/1.  With no growth to date, midline placed 11/3 and patient discharged home on  IV Invanz  Hydronephrosis with obstructing calculus Patient was noted to have mild left-sided hydronephrosis during her last hospitalization and is status post cystoscopy with ureteral stent placement. Per urology indwelling ureteral stent for 7 to 10 days (stent was placed 10/27) May need to consult urology while in house for stent removal Continue Flomax and Ditropan  Some hematuria which looks to be secondary to stent irritation.  Patient has plan to follow-up with urology on 11/8.  Hypokalemia Supplement potassium Check magnesium levels  Hypernatremia-resolved as of 02/15/2022 Sodium of 146 on 11/1.  Likely from some early dehydration.  Will give dose of gentle IV fluids.  Nicotine dependence Smoking cessation was discussed with patient in detail She declines a nicotine transdermal patch         Consultants: Infectious disease Procedures performed: Midline placement 11/3 Disposition: Home Diet recommendation:  Discharge Diet Orders (From admission, onward)     Start     Ordered   02/16/22 0000  Diet - low sodium heart healthy        02/16/22 1707           Regular diet DISCHARGE MEDICATION: Allergies as of 02/16/2022       Reactions   Amoxicillin Other (See Comments)   Yeast infection        Medication List     STOP taking these medications    amoxicillin-clavulanate 875-125 MG tablet Commonly known as: AUGMENTIN   fluconazole 150 MG tablet Commonly known as: Diflucan       TAKE these medications    ertapenem  IVPB Commonly known as: INVANZ Inject 1 g into the vein  daily for 5 days. Indication:  ESBL E. Coli bacteremia First Dose: Yes Last Day of Therapy:  02/22/2022 Labs - Once weekly on Mondays:  CBC/D and CMP Please pull midline at completion of IV antibiotics on 11/9 or 02/23/22 Fax weekly lab results  promptly to [phone] Method of administration: Mini-Bag Plus / Gravity Method of administration may be changed at the discretion  of home infusion pharmacist based upon assessment of the patient and/or caregiver's ability to self-administer the medication ordered. Start taking on: February 17, 2022   oxybutynin 5 MG tablet Commonly known as: DITROPAN Take 1 tablet (5 mg total) by mouth every 8 (eight) hours as needed for bladder spasms (Urinary frequency, urgency, spasm).   tamsulosin 0.4 MG Caps capsule Commonly known as: FLOMAX Take 1 capsule (0.4 mg total) by mouth daily for 14 days.               Discharge Care Instructions  (From admission, onward)           Start     Ordered   02/16/22 0000  Change dressing on IV access line weekly and PRN  (Home infusion instructions - [facility] )        02/16/22 1706            Discharge Exam: There were no vitals filed for this visit. General: Alert and oriented x3, no acute distress Cardiovascular: Regular rate and rhythm, S1-S2  Condition at discharge: good  The results of significant diagnostics from this hospitalization (including imaging, microbiology, ancillary and laboratory) are listed below for reference.   Imaging Studies: US RENAL  Result Date: 02/14/2022 CLINICAL DATA:  Hydronephrosis. EXAM: RENAL / URINARY TRACT ULTRASOUND COMPLETE COMPARISON:  Renal stone CT 02/08/2022 FINDINGS: Right Kidney: Renal measurements: 9.0 x 4.3 x 4.4 cm = volume: 89 mL. Echogenicity within normal limits. No mass or hydronephrosis visualized. Left Kidney: Renal measurements: 10.6 x 5.3 x 4.8 cm = volume: 140 mL. Echogenicity within normal limits. There is prominence of the left renal collecting system/trace left hydronephrosis which appears similar to prior. Shadowing calculi are identified measuring up to 4.2 mm. Bladder: Appears normal for degree of bladder distention. Bilateral ureteral jets are visualized. Stent or catheter is visualized in the bladder. Other: None. IMPRESSION: 1. Stable prominence of the left renal collecting system/trace left  hydronephrosis. 2. Left-sided nephrolithiasis. 3. Stent or catheter is visualized in the bladder. Electronically Signed   By: Ronney Asters M.D.   On: 02/14/2022 17:47   DG OR UROLOGY CYSTO IMAGE ([facility] ONLY)  Result Date: 02/09/2022 There is no interpretation for this exam.  This order is for images obtained during a surgical procedure.  Please See "Surgeries" Tab for more information regarding the procedure.   CT Renal Stone Study  Result Date: 02/08/2022 CLINICAL DATA:  Recurrent urinary tract infection, flank pain, low back pain EXAM: CT ABDOMEN AND PELVIS WITHOUT CONTRAST TECHNIQUE: Multidetector CT imaging of the abdomen and pelvis was performed following the standard protocol without IV contrast. RADIATION DOSE REDUCTION: This exam was performed according to the departmental dose-optimization program which includes automated exposure control, adjustment of the mA and/or kV according to patient size and/or use of iterative reconstruction technique. COMPARISON:  04/10/2020 FINDINGS: Lower chest: Mild left basilar atelectasis. Cardiac size within normal limits. Hepatobiliary: No focal liver abnormality is seen. Status post cholecystectomy. No biliary dilatation. Pancreas: Unremarkable Spleen: Unremarkable Adrenals/Urinary Tract: The adrenal glands are unremarkable. The kidneys are normal in size and position.  There is extensive bilateral perinephric stranding suggesting an underlying bilateral infectious or inflammatory nephritis. No loculated perinephric fluid collections. There is superimposed minimal left hydronephrosis secondary to a minimally obstructing 4 mm calculus within the distal left ureter seen 1-2 cm proximal to the left ureterovesicular junction. Numerous additional nonobstructing calculi are noted within the mid and lower pole of the left kidney measuring up to 4 mm. No hydronephrosis on the right. No nephro or urolithiasis on the right. The bladder is unremarkable. Stomach/Bowel:  Stomach is within normal limits. Appendix appears normal. No evidence of bowel wall thickening, distention, or inflammatory changes. Vascular/Lymphatic: Aortic atherosclerosis. No pathologically enlarged abdominal or pelvic lymph nodes. Shotty left pararenal adenopathy may be reactive in nature. Reproductive: Status post hysterectomy. No adnexal masses. Other: No abdominal wall hernia or abnormality. No abdominopelvic ascites. Musculoskeletal: Osseous structures are age-appropriate. No acute bone abnormality. No lytic or blastic bone lesion. IMPRESSION: 1. Minimally obstructing 4 mm calculus within the distal left ureter resulting in minimal left hydronephrosis. Superimposed mild left nonobstructing nephrolithiasis. 2. Extensive bilateral perinephric stranding suggesting an underlying infectious or inflammatory nephritis. No loculated perinephric fluid collections. Correlation with urinalysis and urine culture is recommended for further evaluation. 3. Aortic atherosclerosis. Aortic Atherosclerosis (ICD10-I70.0). Electronically Signed   By: Helyn Numbers M.D.   On: 02/08/2022 12:12    Microbiology: Results for orders placed or performed during the hospital encounter of 02/13/22  Culture, blood (Routine X 2) w Reflex to ID Panel     Status: None (Preliminary result)   Collection Time: 02/14/22 10:20 PM   Specimen: BLOOD  Result Value Ref Range Status   Specimen Description BLOOD LEFT ANTECUBITAL  Final   Special Requests   Final    BOTTLES DRAWN AEROBIC AND ANAEROBIC Blood Culture adequate volume   Culture   Final    NO GROWTH 2 DAYS Performed at [facility], [address] [place], [place] [address]    Report Status PENDING  Incomplete  Culture, blood (Routine X 2) w Reflex to ID Panel     Status: None (Preliminary result)   Collection Time: 02/14/22 10:26 PM   Specimen: BLOOD  Result Value Ref Range Status   Specimen Description BLOOD BLOOD LEFT WRIST  Final   Special Requests    Final    BOTTLES DRAWN AEROBIC AND ANAEROBIC Blood Culture adequate volume   Culture   Final    NO GROWTH 2 DAYS Performed at [facility], [address] [place], [place] [address]    Report Status PENDING  Incomplete    Labs: CBC: Recent Labs  Lab 02/10/22 0502 02/13/22 1440 02/14/22 0622 02/15/22 0536 02/16/22 0940  WBC 8.7 13.3* 13.1* 13.6* 13.9*  NEUTROABS  --  9.9*  --   --   --   HGB 13.9 14.2 13.5 12.7 12.5  HCT 42.9 44.4 43.0 40.3 39.3  MCV 87.4 89.2 89.8 91.0 89.3  PLT 235 340 324 305 319   Basic Metabolic Panel: Recent Labs  Lab 02/10/22 0502 02/13/22 1440 02/14/22 0622 02/15/22 0536 02/16/22 0940  NA 141 144 146* 144 145  K 3.2* 2.8* 3.7 3.2* 3.4*  CL 111 109 114* 111 109  CO2 23 25 27 26 26   GLUCOSE 217* 166* 81 93 163*  BUN 20 8 8 9 9   CREATININE 0.77 0.81 0.75 0.75 0.74  CALCIUM 8.2* 8.2* 8.2* 8.0* 8.4*  MG  --  1.8  --   --   --    Liver  Function Tests: No results for input(s): "AST", "ALT", "ALKPHOS", "BILITOT", "PROT", "ALBUMIN" in the last 168 hours. CBG: No results for input(s): "GLUCAP" in the last 168 hours.  Discharge time spent: greater than 30 minutes.  Signed: Hollice Espy, MD [facility] 02/16/2022

## 2022-02-16 NOTE — Discharge Instructions (Signed)
Midline removal on Thursday, November 9th at 10:30am  Location: Same Day Surgery at [facility] (2nd floor) If need to cancel or change appointment please call: [phone]

## 2022-02-16 NOTE — Progress Notes (Signed)
PHARMACY CONSULT NOTE FOR:  OUTPATIENT  PARENTERAL ANTIBIOTIC THERAPY (OPAT)  Indication: ESBL E. Coli bacteremia Regimen: Ertapenem 1 g IV q24H End date: 02/22/2022  Labs weekly on Monday while on IV antibiotics: _X_ CBC with differential _X_ CMP Please pull midline at completion of IV antibiotics on 11/9 or 02/23/22   Fax weekly lab results  promptly to [phone]  IV antibiotic discharge orders are pended. To discharging provider:  please sign these orders via discharge navigator,  Select New Orders & click on the button choice - Manage This Unsigned Work.     Thank you for allowing pharmacy to be a part of this patient's care.  Dara Hoyer, PharmD PGY-1 Pharmacy Resident 02/16/2022 9:57 AM

## 2022-02-17 NOTE — TOC CM/SW Note (Signed)
CSW notified by MD that patient called and stated she does not have "the part that connects the [facility] line to everything else." CSW called Pam with [facility] and asked that they reach out to the patient and this be addressed today.  Oleh Genin, [place]

## 2022-02-19 LAB — CULTURE, BLOOD (ROUTINE X 2)
Culture: NO GROWTH
Culture: NO GROWTH
Special Requests: ADEQUATE
Special Requests: ADEQUATE

## 2022-02-21 ENCOUNTER — Ambulatory Visit (INDEPENDENT_AMBULATORY_CARE_PROVIDER_SITE_OTHER): Payer: Self-pay | Admitting: Urology

## 2022-02-21 ENCOUNTER — Encounter: Payer: Self-pay | Admitting: Urology

## 2022-02-21 VITALS — BP 158/82 | HR 80 | Ht 63.0 in | Wt 132.0 lb

## 2022-02-21 DIAGNOSIS — Z466 Encounter for fitting and adjustment of urinary device: Secondary | ICD-10-CM

## 2022-02-21 LAB — URINALYSIS, COMPLETE
Bilirubin, UA: NEGATIVE
Glucose, UA: NEGATIVE
Ketones, UA: NEGATIVE
Nitrite, UA: NEGATIVE
Specific Gravity, UA: 1.02 (ref 1.005–1.030)
Urobilinogen, Ur: 0.2 mg/dL (ref 0.2–1.0)
pH, UA: 7 (ref 5.0–7.5)

## 2022-02-21 LAB — MICROSCOPIC EXAMINATION: RBC, Urine: >30 /hpf — AB (ref 0–2)

## 2022-02-21 NOTE — Progress Notes (Signed)
Indications: Patient is 56 y.o., who is s/p ureteroscopic removal of a 4 mm left distal ureteral calculus 02/09/2022.  She was admitted with flank pain and history of fever and + blood culture.  Had minimal hydronephrosis and underwent ureteroscopic removal after 24 hours of IV antibiotic.  No temp spike post procedure.  She was readmitted after discharge for further IV antibiotic therapy due to ESBL E. coli bacteremia.  She states she has 2 doses of antibiotic remaining the patient is presenting today for stent removal.  Procedure:  Flexible Cystoscopy with stent removal (39767)  Timeout was performed and the correct patient, procedure and participants were identified.    Description:  The patient was prepped and draped in the usual sterile fashion. Flexible cystosopy was performed.  The stent was visualized, grasped, and removed intact without difficulty. The patient tolerated the procedure well.  A single dose of oral antibiotics was given.  Complications:  None  Plan:  Instructed to call for fever/flank pain post stent removal PA follow-up 1 month for repeat UA and review of stone analysis   Irineo Axon, MD

## 2022-02-22 ENCOUNTER — Ambulatory Visit
Admit: 2022-02-22 | Discharge: 2022-02-22 | Disposition: A | Discharge status: Home / Self Care | Payer: Self-pay | Attending: Infectious Diseases | Admitting: Infectious Diseases

## 2022-02-22 NOTE — Progress Notes (Signed)
Midline single lumen removed without complication per policy protocol .VSS and patient has no signs of acute distress.

## 2022-03-22 NOTE — Progress Notes (Incomplete)
03/23/2022 9:37 AM   Martha Welch 07-26-1965 [id number]  Referring provider: No referring provider defined for this encounter.  Urological history: 1. rUTI's -Contributing factors of age, vaginal atrophy,  -Documented urine cultures over the last year  February 09, 2022 no growth  February 08, 2022 E. Coli  2.  Nephrolithiasis -Stone composition calcium oxalate -CT renal stone (01/2022) -bilateral nephrolithiasis -Left URS (01/2022)   No chief complaint on file.   HPI: Martha Welch is a 56 y.o. female who presents today for one month follow up.   She underwent left ureteroscopy for an obstructing 4 mm left distal ureteral calculi on February 09, 2022 with Dr. Lonna Cobb.  The stent was removed in the office on February 21, 2022.  Stone analysis revealed a calcium oxalate stone.  UA ***   PMH: Past Medical History:  Diagnosis Date   Anemia     Surgical History: Past Surgical History:  Procedure Laterality Date   ABDOMINAL HYSTERECTOMY     CESAREAN SECTION     CHOLECYSTECTOMY     CYSTOSCOPY WITH STENT PLACEMENT Left 02/09/2022   Procedure: CYSTOSCOPY WITH STENT PLACEMENT;  Surgeon: Riki Altes, MD;  Location: [facility];  Service: Urology;  Laterality: Left;   CYSTOSCOPY/URETEROSCOPY/HOLMIUM LASER/STENT PLACEMENT Left 02/09/2022   Procedure: CYSTOSCOPY/URETEROSCOPY/HOLMIUM LASER/STENT PLACEMENT;  Surgeon: Riki Altes, MD;  Location: [facility];  Service: Urology;  Laterality: Left;    Home Medications:  Allergies as of 03/23/2022       Reactions   Amoxicillin Other (See Comments)   Yeast infection        Medication List        Accurate as of March 22, 2022  9:37 AM. If you have any questions, ask your nurse or doctor.          oxybutynin 5 MG tablet Commonly known as: DITROPAN Take 1 tablet (5 mg total) by mouth every 8 (eight) hours as needed for bladder spasms (Urinary frequency, urgency, spasm).        Allergies:  Allergies   Allergen Reactions   Amoxicillin Other (See Comments)    Yeast infection    Family History: No family history on file.  Social History:  reports that she has been smoking cigarettes. She has been smoking an average of 1.5 packs per day. She has never used smokeless tobacco. She reports current alcohol use. She reports that she does not use drugs.  ROS: Pertinent ROS in HPI  Physical Exam: There were no vitals taken for this visit.  Constitutional:  Well nourished. Alert and oriented, No acute distress. HEENT: [place] AT, moist mucus membranes.  Trachea midline, no masses. Cardiovascular: No clubbing, cyanosis, or edema. Respiratory: Normal respiratory effort, no increased work of breathing. GI: Abdomen is soft, non tender, non distended, no abdominal masses. Liver and spleen not palpable.  No hernias appreciated.  Stool sample for occult testing is not indicated.   GU: No CVA tenderness.  No bladder fullness or masses.  Patient with circumcised/uncircumcised phallus. ***Foreskin easily retracted***  Urethral meatus is patent.  No penile discharge. No penile lesions or rashes. Scrotum without lesions, cysts, rashes and/or edema.  Testicles are located scrotally bilaterally. No masses are appreciated in the testicles. Left and right epididymis are normal. Rectal: Patient with  normal sphincter tone. Anus and perineum without scarring or rashes. No rectal masses are appreciated. Prostate is approximately *** grams, *** nodules are appreciated. Seminal vesicles are normal. Skin: No rashes, bruises or suspicious  lesions. Lymph: No cervical or inguinal adenopathy. Neurologic: Grossly intact, no focal deficits, moving all 4 extremities. Psychiatric: Normal mood and affect.  Laboratory Data: Lab Results  Component Value Date   WBC 13.9 (H) 02/16/2022   HGB 12.5 02/16/2022   HCT 39.3 02/16/2022   MCV 89.3 02/16/2022   PLT 319 02/16/2022    Lab Results  Component Value Date   CREATININE  0.74 02/16/2022    Lab Results  Component Value Date   HGBA1C 5.8 (H) 02/09/2022    Urinalysis    Component Value Date/Time   COLORURINE YELLOW (A) 02/08/2022 1026   APPEARANCEUR Hazy (A) 02/21/2022 0803   LABSPEC 1.014 02/08/2022 1026   PHURINE 5.0 02/08/2022 1026   GLUCOSEU Negative 02/21/2022 0803   HGBUR MODERATE (A) 02/08/2022 1026   BILIRUBINUR Negative 02/21/2022 0803   KETONESUR 5 (A) 02/08/2022 1026   PROTEINUR 2+ (A) 02/21/2022 0803   PROTEINUR 100 (A) 02/08/2022 1026   NITRITE Negative 02/21/2022 0803   NITRITE POSITIVE (A) 02/08/2022 1026   LEUKOCYTESUR 2+ (A) 02/21/2022 0803   LEUKOCYTESUR LARGE (A) 02/08/2022 1026  I have reviewed the labs.   Pertinent Imaging: N/A  Assessment & Plan:  ***  1.  Nephrolithiasis -explained that how kidney stones can form  -Encouraged her to increase your fluid intake to around 2.5 - 3 liters per day -Encouraged her to decrease salt intake to less than 2000mg /day  -Encouraged limiting the amount of Oxalate containing foods in diet to 50 mg per day.  -Encouraged limit the amount of animal proteins in diet to approximately the size of 1 deck of cards per meal -Encourage increasing the amount of citrate in your diet  -given ABC's of stone prevention hand out -RTC in 6 months for KUB  No follow-ups on file.  These notes generated with voice recognition software. I apologize for typographical errors.   [facility] [address]  [address] [place], [place] [phone]

## 2022-03-23 ENCOUNTER — Ambulatory Visit: Payer: Self-pay | Admitting: Urology

## 2022-03-23 DIAGNOSIS — N200 Calculus of kidney: Secondary | ICD-10-CM

## 2022-03-27 ENCOUNTER — Encounter: Payer: Self-pay | Admitting: Urology

## 2022-03-28 ENCOUNTER — Telehealth: Payer: Self-pay | Admitting: Urology

## 2022-03-28 NOTE — Telephone Encounter (Signed)
Martha Welch missed her appointment with me on December 8 to recheck her urine and to review her stone analysis.  Would you call her and get that rescheduled?  It is important that she keep this follow up appointment because if she still has residual blood or infection, we need to get this treated quickly to avoid a return to the hospital.

## 2022-03-29 NOTE — Telephone Encounter (Signed)
LMTRC. Needs to r/s appt.

## 2022-04-02 NOTE — Telephone Encounter (Signed)
LM to contact office for an appt.

## 2022-04-18 NOTE — Telephone Encounter (Signed)
Letter mailed

## 2022-04-18 NOTE — Telephone Encounter (Signed)
LM to contact office for an appt on cell.  Home number disconnected.

## 2022-04-30 ENCOUNTER — Encounter: Payer: Self-pay | Admitting: Urology

## 2023-08-20 ENCOUNTER — Other Ambulatory Visit: Payer: Self-pay

## 2023-08-20 ENCOUNTER — Encounter: Payer: Self-pay | Admitting: Emergency Medicine

## 2023-08-20 ENCOUNTER — Emergency Department: Payer: Self-pay

## 2023-08-20 ENCOUNTER — Emergency Department: Payer: MEDICAID

## 2023-08-20 ENCOUNTER — Inpatient Hospital Stay
Admission: EM | Admit: 2023-08-20 | Discharge: 2023-08-23 | DRG: 287 | Disposition: A | Discharge status: Home / Self Care | Payer: Self-pay | Attending: Internal Medicine | Admitting: Internal Medicine

## 2023-08-20 ENCOUNTER — Inpatient Hospital Stay (HOSPITAL_COMMUNITY)
Admit: 2023-08-20 | Discharge: 2023-08-20 | Disposition: A | Discharge status: Home / Self Care | Payer: MEDICAID | Attending: Internal Medicine | Admitting: Internal Medicine

## 2023-08-20 DIAGNOSIS — I420 Dilated cardiomyopathy: Secondary | ICD-10-CM | POA: Diagnosis present

## 2023-08-20 DIAGNOSIS — Z880 Allergy status to penicillin: Secondary | ICD-10-CM

## 2023-08-20 DIAGNOSIS — F17210 Nicotine dependence, cigarettes, uncomplicated: Secondary | ICD-10-CM | POA: Diagnosis present

## 2023-08-20 DIAGNOSIS — I5041 Acute combined systolic (congestive) and diastolic (congestive) heart failure: Principal | ICD-10-CM | POA: Diagnosis present

## 2023-08-20 DIAGNOSIS — I509 Heart failure, unspecified: Secondary | ICD-10-CM

## 2023-08-20 DIAGNOSIS — E876 Hypokalemia: Secondary | ICD-10-CM | POA: Diagnosis present

## 2023-08-20 DIAGNOSIS — Z8249 Family history of ischemic heart disease and other diseases of the circulatory system: Secondary | ICD-10-CM

## 2023-08-20 DIAGNOSIS — Z9049 Acquired absence of other specified parts of digestive tract: Secondary | ICD-10-CM

## 2023-08-20 DIAGNOSIS — R7303 Prediabetes: Secondary | ICD-10-CM | POA: Diagnosis present

## 2023-08-20 DIAGNOSIS — F1010 Alcohol abuse, uncomplicated: Secondary | ICD-10-CM | POA: Diagnosis present

## 2023-08-20 DIAGNOSIS — R601 Generalized edema: Principal | ICD-10-CM

## 2023-08-20 DIAGNOSIS — I493 Ventricular premature depolarization: Secondary | ICD-10-CM | POA: Diagnosis present

## 2023-08-20 DIAGNOSIS — J9811 Atelectasis: Secondary | ICD-10-CM | POA: Diagnosis present

## 2023-08-20 DIAGNOSIS — I3139 Other pericardial effusion (noninflammatory): Secondary | ICD-10-CM | POA: Diagnosis present

## 2023-08-20 DIAGNOSIS — Z87440 Personal history of urinary (tract) infections: Secondary | ICD-10-CM

## 2023-08-20 DIAGNOSIS — Z720 Tobacco use: Secondary | ICD-10-CM

## 2023-08-20 LAB — CBC
HCT: 46.9 % — ABNORMAL HIGH (ref 36.0–46.0)
Hemoglobin: 14.9 g/dL (ref 12.0–15.0)
MCH: 28.9 pg (ref 26.0–34.0)
MCHC: 31.8 g/dL (ref 30.0–36.0)
MCV: 90.9 fL (ref 80.0–100.0)
Platelets: 156 10*3/uL (ref 150–400)
RBC: 5.16 MIL/uL — ABNORMAL HIGH (ref 3.87–5.11)
RDW: 14.3 % (ref 11.5–15.5)
WBC: 9.6 10*3/uL (ref 4.0–10.5)
nRBC: 0 % (ref 0.0–0.2)

## 2023-08-20 LAB — HEPATIC FUNCTION PANEL
ALT: 19 U/L (ref 0–44)
AST: 22 U/L (ref 15–41)
Albumin: 3.1 g/dL — ABNORMAL LOW (ref 3.5–5.0)
Alkaline Phosphatase: 92 U/L (ref 38–126)
Bilirubin, Direct: 0.1 mg/dL (ref 0.0–0.2)
Indirect Bilirubin: 0.7 mg/dL (ref 0.3–0.9)
Total Bilirubin: 0.8 mg/dL (ref 0.0–1.2)
Total Protein: 5.6 g/dL — ABNORMAL LOW (ref 6.5–8.1)

## 2023-08-20 LAB — PROTEIN / CREATININE RATIO, URINE
Creatinine, Urine: <10 mg/dL
Total Protein, Urine: 12 mg/dL

## 2023-08-20 LAB — BASIC METABOLIC PANEL WITH GFR
Anion gap: 9 (ref 5–15)
BUN: 10 mg/dL (ref 6–20)
CO2: 25 mmol/L (ref 22–32)
Calcium: 8.1 mg/dL — ABNORMAL LOW (ref 8.9–10.3)
Chloride: 108 mmol/L (ref 98–111)
Creatinine, Ser: 0.95 mg/dL (ref 0.44–1.00)
GFR, Estimated: >60 mL/min (ref >60)
Glucose, Bld: 137 mg/dL — ABNORMAL HIGH (ref 70–99)
Potassium: 3.2 mmol/L — ABNORMAL LOW (ref 3.5–5.1)
Sodium: 142 mmol/L (ref 135–145)

## 2023-08-20 LAB — PROCALCITONIN: Procalcitonin: <0.1 ng/mL

## 2023-08-20 LAB — MAGNESIUM: Magnesium: 1.6 mg/dL — ABNORMAL LOW (ref 1.7–2.4)

## 2023-08-20 LAB — BRAIN NATRIURETIC PEPTIDE: B Natriuretic Peptide: 1570.7 pg/mL — ABNORMAL HIGH (ref 0.0–100.0)

## 2023-08-20 LAB — LACTIC ACID, PLASMA: Lactic Acid, Venous: 1 mmol/L (ref 0.5–1.9)

## 2023-08-20 LAB — TSH: TSH: 3.773 u[IU]/mL (ref 0.350–4.500)

## 2023-08-20 MED ORDER — ACETAMINOPHEN 325 MG PO TABS
650.0000 mg | ORAL_TABLET | ORAL | Status: DC | PRN
  Administered 2023-08-20 – 2023-08-23 (×4): 650 mg via ORAL
  Filled 2023-08-20 (×4): qty 2

## 2023-08-20 MED ORDER — SODIUM CHLORIDE 0.9% FLUSH: 3.0000 mL | INTRAVENOUS | Status: DC | PRN

## 2023-08-20 MED ORDER — FUROSEMIDE 40 MG PO TABS: 20.0000 mg | ORAL_TABLET | Freq: Once | ORAL | Status: DC

## 2023-08-20 MED ORDER — ONDANSETRON HCL 4 MG/2ML IJ SOLN: 4.0000 mg | Freq: Four times a day (QID) | INTRAMUSCULAR | Status: DC | PRN

## 2023-08-20 MED ORDER — FUROSEMIDE 10 MG/ML IJ SOLN
20.0000 mg | Freq: Once | INTRAMUSCULAR | Status: AC
  Administered 2023-08-20: 20 mg via INTRAVENOUS
  Filled 2023-08-20: qty 4

## 2023-08-20 MED ORDER — SODIUM CHLORIDE 0.9% FLUSH
3.0000 mL | Freq: Two times a day (BID) | INTRAVENOUS | Status: DC
  Administered 2023-08-20 – 2023-08-22 (×5): 3 mL via INTRAVENOUS

## 2023-08-20 MED ORDER — POTASSIUM CHLORIDE CRYS ER 20 MEQ PO TBCR
40.0000 meq | EXTENDED_RELEASE_TABLET | Freq: Once | ORAL | Status: AC
  Administered 2023-08-20: 40 meq via ORAL
  Filled 2023-08-20: qty 2

## 2023-08-20 MED ORDER — ENOXAPARIN SODIUM 40 MG/0.4ML IJ SOSY
40.0000 mg | PREFILLED_SYRINGE | INTRAMUSCULAR | Status: DC
  Administered 2023-08-20 – 2023-08-22 (×3): 40 mg via SUBCUTANEOUS
  Filled 2023-08-20 (×3): qty 0.4

## 2023-08-20 MED ORDER — SODIUM CHLORIDE 0.9 % IV SOLN: 250.0000 mL | INTRAVENOUS | Status: AC | PRN

## 2023-08-20 MED ORDER — SPIRONOLACTONE 25 MG PO TABS
25.0000 mg | ORAL_TABLET | Freq: Every day | ORAL | Status: DC
Start: 2023-08-20 — End: 2023-08-23
  Administered 2023-08-20 – 2023-08-22 (×3): 25 mg via ORAL
  Filled 2023-08-20 (×3): qty 1

## 2023-08-20 MED ORDER — FUROSEMIDE 10 MG/ML IJ SOLN
40.0000 mg | Freq: Two times a day (BID) | INTRAMUSCULAR | Status: DC
  Administered 2023-08-20 – 2023-08-22 (×4): 40 mg via INTRAVENOUS
  Filled 2023-08-20 (×4): qty 4

## 2023-08-20 MED ORDER — FUROSEMIDE 10 MG/ML IJ SOLN: 40.0000 mg | Freq: Once | INTRAMUSCULAR | Status: DC

## 2023-08-20 MED ORDER — FUROSEMIDE 10 MG/ML IJ SOLN: 40.0000 mg | Freq: Every day | INTRAMUSCULAR | Status: DC

## 2023-08-20 NOTE — Progress Notes (Addendum)
 PHARMACY CONSULT NOTE - FOLLOW UP  Pharmacy Consult for Electrolyte Monitoring and Replacement   Recent Labs: Potassium (mmol/L)  Date Value  08/20/2023 3.2 (L)   Magnesium (mg/dL)  Date Value  16/01/9603 1.8   Calcium (mg/dL)  Date Value  54/12/8117 8.1 (L)   Albumin (g/dL)  Date Value  14/78/2956 3.1 (L)   Sodium (mmol/L)  Date Value  08/20/2023 142     Assessment: Patient is a 58yo female presenting with increased swelling. Pharmacy consulted for electrolyte management.  Goal of Therapy:  Maintain electrolytes within range  Plan:  - K 3.2, received KCl 40mEq PO x 1 already, will give an additional dose of KCl 40mEq PO x 1 with tonight's dose of IV Lasix - Follow up on AM labs  Dania Dupre, PharmD, BCPS 08/20/2023 1:26 PM

## 2023-08-20 NOTE — Assessment & Plan Note (Addendum)
 Patient is presenting with approximately 5 months of lower extremity swelling with worsening over the last 1 to 2 weeks with evidence of significant anasarca on examination and elevated BNP at 1500 suggesting cardiac etiology.  Will also rule out nephrotic syndrome with urine studies.  Unclear etiology at this time, however differential includes postviral cardiomyopathy given symptoms started after URI.  - Cardiology consulted; appreciate their recommendations - Telemetry monitoring - Echocardiogram ordered - TSH - Urine protein creatinine ratio and urine microalbumin creatinine ratio - S/p Lasix 20 mg IV once - Start Lasix 40 mg daily tomorrow - Daily weights - Strict and out - Daily BMP and magnesium - Electrolyte monitoring per pharmacy protocol

## 2023-08-20 NOTE — ED Triage Notes (Signed)
 Pt comes in from home via pov with complaints of SOB with ambulation and body swelling. Pt states that both of her legs are swollen, her abdomen, and her eyelids. Pt states that she has had the swelling the past 6 months or so, but this is the worse it has has been. Pt complains of pain down the middle of her shins, and bilateral hip pain with ambulation 6/10. Pt is ambulatory in triage with no signs of acute distress at this time.

## 2023-08-20 NOTE — H&P (Signed)
 History and Physical    Patient: Martha Welch OZH:[id number] DOB: 05/07/1965 DOA: 08/20/2023 DOS: the patient was seen and examined on 08/20/2023 PCP: Pcp, No  Patient coming from: Home  Chief Complaint:  Chief Complaint  Patient presents with   Shortness of Breath   HPI: Martha Welch is a 58 y.o. female with medical history significant of recurrent nephrolithiasis, who presents to the ED due to swelling.  Martha Welch states that for the last 5 months, she has been experiencing gradually worsening lower extremity swelling.  During this time, she is also experienced increased hip and shin pain.  Then in the last 1 to 2 weeks, she has noticed swelling of her abdomen to the point that she was unable to buckle her pants earlier today.  She is also experiencing facial swelling especially around the eyes.  She denies any orthopnea, however her husband at bedside states that she coughs very heavily at night when she lays flat.    Martha Welch states that it seems all her symptoms began after she had a severe cold back in November 2024 and she never recovered afterwards.  ED course: On arrival to the ED, patient was normotensive at 121/67 with heart rate of 88.  She was saturating at 93% on room air.  She was afebrile 97.9.  Initial workup notable for unremarkable CBC, potassium 3.2, creatinine 0.95, albumin 3.1 and GFR above 60.  BNP 1570.  Chest x-ray with bilateral infiltrates.  Right upper quadrant ultrasound with right pleural effusion but no abnormality of the liver or bile ducts.  Patient started on Lasix, potassium, and cardiology was consulted.  TRH contacted for admission.  Review of Systems: As mentioned in the history of present illness. All other systems reviewed and are negative.  Past Medical History:  Diagnosis Date   Anemia    Past Surgical History:  Procedure Laterality Date   ABDOMINAL HYSTERECTOMY     CESAREAN SECTION     CHOLECYSTECTOMY     CYSTOSCOPY WITH STENT  PLACEMENT Left 02/09/2022   Procedure: CYSTOSCOPY WITH STENT PLACEMENT;  Surgeon: Geraline Knapp, MD;  Location: [facility];  Service: Urology;  Laterality: Left;   CYSTOSCOPY/URETEROSCOPY/HOLMIUM LASER/STENT PLACEMENT Left 02/09/2022   Procedure: CYSTOSCOPY/URETEROSCOPY/HOLMIUM LASER/STENT PLACEMENT;  Surgeon: Geraline Knapp, MD;  Location: [facility];  Service: Urology;  Laterality: Left;   Social History:  reports that she has been smoking cigarettes. She has never used smokeless tobacco. She reports current alcohol use. She reports that she does not use drugs.  Allergies  Allergen Reactions   Amoxicillin  Other (See Comments)    Yeast infection    History reviewed. No pertinent family history.  Prior to Admission medications   Medication Sig Start Date End Date Taking? Authorizing Provider  oxybutynin  (DITROPAN ) 5 MG tablet Take 1 tablet (5 mg total) by mouth every 8 (eight) hours as needed for bladder spasms (Urinary frequency, urgency, spasm). 02/10/22   Krishnan, Sendil K, MD    Physical Exam: Vitals:   08/20/23 0929 08/20/23 1000 08/20/23 1030 08/20/23 1130  BP:  121/67 124/77 114/77  Pulse:  90 88 96  Resp:  20  18  Temp:      SpO2:  93% 92% 95%  Weight: 66.7 kg     Height: 5\' 3"  (1.6 m)      Physical Exam Vitals and nursing note reviewed.  Constitutional:      General: She is not in acute distress.    Appearance: She is  normal weight.  HENT:     Head: Normocephalic and atraumatic.     Mouth/Throat:     Mouth: Mucous membranes are moist.     Pharynx: Oropharynx is clear.  Eyes:     Conjunctiva/sclera: Conjunctivae normal.     Pupils: Pupils are equal, round, and reactive to light.  Cardiovascular:     Rate and Rhythm: Normal rate and regular rhythm.     Heart sounds: No murmur heard. Pulmonary:     Effort: Pulmonary effort is normal. No tachypnea.     Breath sounds: Wheezing (Intermittent expiratory wheezing) and rales (Up to the superior lung fields) present.  No decreased breath sounds or rhonchi.  Abdominal:     General: There is distension.     Tenderness: There is no abdominal tenderness. There is no guarding.  Musculoskeletal:     Right lower leg: 2+ Pitting Edema present.     Left lower leg: 2+ Pitting Edema present.  Neurological:     General: No focal deficit present.     Mental Status: She is alert and oriented to person, place, and time.  Psychiatric:        Mood and Affect: Mood normal.        Behavior: Behavior normal.    Data Reviewed: CBC with WBC of 9.6, hemoglobin of 14.9, platelets of 156 CMP with sodium of 142, potassium 3.2, bicarb 25, glucose 137, BUN 10, creat 0.95, AST 22, ALT 19, GFR above 60 BNP 1570  EKG personally reviewed.  Sinus rhythm with rate of 110.  Several PVCs causing irregularity though.  No acute ischemic changes. Telemetry personally reviewed.  Frequent PVCs with intermittent ventricular bigeminy and trigeminy.  US  ABDOMEN LIMITED RUQ (LIVER/GB) Result Date: 08/20/2023 CLINICAL DATA:  [id number] Abdominal pain [id number]. EXAM: ULTRASOUND ABDOMEN LIMITED RIGHT UPPER QUADRANT COMPARISON:  None Available. FINDINGS: Gallbladder: Surgically absent. Common bile duct: Diameter: 4-7 mm.  No intrahepatic bile duct dilation. Liver: No focal lesion identified. Within normal limits in parenchymal echogenicity. Portal vein is patent on color Doppler imaging with normal direction of blood flow towards the liver. Other: Incidentally noted is right pleural effusion. IMPRESSION: *Surgically absent gallbladder. No significant sonographic abnormality of the liver or bile ducts. *Right pleural effusion. Electronically Signed   By: Beula Brunswick M.D.   On: 08/20/2023 11:45   DG HIP UNILAT WITH PELVIS 2-3 VIEWS RIGHT Result Date: 08/20/2023 CLINICAL DATA:  Right hip pain EXAM: DG HIP (WITH OR WITHOUT PELVIS) 2-3V RIGHT COMPARISON:  None Available. FINDINGS: There is no evidence of hip fracture or dislocation. There is no evidence of  arthropathy or other focal bone abnormality. IMPRESSION: Negative. Electronically Signed   By: Fredrich Jefferson M.D.   On: 08/20/2023 11:00   DG Chest 2 View Result Date: 08/20/2023 CLINICAL DATA:  Shortness of breath EXAM: CHEST - 2 VIEW COMPARISON:  December 05, 2018 FINDINGS: Bilateral basilar infiltrates and atelectasis with a small bilateral pleural effusions or reactions without consolidations. Heart and mediastinum normal IMPRESSION: Basilar infiltrates and atelectasis Electronically Signed   By: Fredrich Jefferson M.D.   On: 08/20/2023 10:16   Results are pending, will review when available.  Assessment and Plan:  * Acute heart failure (HCC) Patient is presenting with approximately 5 months of lower extremity swelling with worsening over the last 1 to 2 weeks with evidence of significant anasarca on examination and elevated BNP at 1500 suggesting cardiac etiology.  Will also rule out nephrotic syndrome with urine studies.  Unclear etiology  at this time, however differential includes postviral cardiomyopathy given symptoms started after URI.  - Cardiology consulted; appreciate their recommendations - Telemetry monitoring - Echocardiogram ordered - TSH - Urine protein creatinine ratio and urine microalbumin creatinine ratio - S/p Lasix 20 mg IV once - Start Lasix 40 mg daily tomorrow - Daily weights - Strict and out - Daily BMP and magnesium - Electrolyte monitoring per pharmacy protocol  Frequent PVCs Frequent PVCs noted on EKG and telemetry, although asymptomatic.  Unclear if this is contributing to acute heart failure, or secondary to CHF.  - Telemetry monitoring - Hold off on beta-blockers pending cardiology evaluation given hypervolemia with unknown EF  Hypokalemia Long-term history of chronic hypokalemia.  - Aggressive potassium supplementation while diuresing - Daily magnesium  Prediabetes Last A1c of 5.8% approximately 1 year ago.  - Repeat A1c pending  Ad[facility]  Planning:   Code Status: Full Code   Consults: Cardiology  Family Communication: Patient's husband and son updated at bedside  Severity of Illness: The appropriate patient status for this patient is INPATIENT. Inpatient status is judged to be reasonable and necessary in order to provide the required intensity of service to ensure the patient's safety. The patient's presenting symptoms, physical exam findings, and initial radiographic and laboratory data in the context of their chronic comorbidities is felt to place them at high risk for further clinical deterioration. Furthermore, it is not anticipated that the patient will be medically stable for discharge from the hospital within 2 midnights of admission.   * I certify that at the point of admission it is my clinical judgment that the patient will require inpatient hospital care spanning beyond 2 midnights from the point of admission due to high intensity of service, high risk for further deterioration and high frequency of surveillance required.*  Author: Avi Body, MD 08/20/2023 12:54 PM  For on call review [email or web address].

## 2023-08-20 NOTE — Assessment & Plan Note (Signed)
 Long-term history of chronic hypokalemia.  - Aggressive potassium supplementation while diuresing - Daily magnesium

## 2023-08-20 NOTE — ED Provider Notes (Signed)
 [facility] Provider Note    Event Date/Time   First MD Initiated Contact with Patient 08/20/23 0957     (approximate)   History   Shortness of Breath   HPI  Martha Welch is a 58 y.o. female history of nephrolithiasis, urinary tract infections  Patient reports that for several months she has been experiencing pain in her right hip that worsens after work, feels like a vice grip feeling in her right hip joint.  Pain gets worse throughout the day.  There is been no swelling in the hip area itself no fevers or chills.  Patient also relates she is concerned because she is over the last week or so developed increasing swelling, cannot button her pants any longer, she is also noticed swelling around her eyelids and in both lower legs.  She denies shortness of breath no chest pain.  She does not currently take any prescription medications.  She has a remote history of a kidney stone and urinary tract infection in the past no symptoms of similar  Denies history of congestive heart failure.  Denies history of any liver disease or heavy alcohol use.      Physical Exam   Triage Vital Signs: ED Triage Vitals  Encounter Vitals Group     BP 08/20/23 0928 139/75     Systolic BP Percentile --      Diastolic BP Percentile --      Pulse Rate 08/20/23 0928 (!) 113     Resp 08/20/23 0928 19     Temp 08/20/23 0928 97.9 F (36.6 C)     Temp src --      SpO2 08/20/23 0928 95 %     Weight 08/20/23 0929 147 lb (66.7 kg)     Height 08/20/23 0929 5\' 3"  (1.6 m)     Head Circumference --      Peak Flow --      Pain Score 08/20/23 0928 6     Pain Loc --      Pain Education --      Exclude from Growth Chart --     Most recent vital signs: Vitals:   08/20/23 1500 08/20/23 1530  BP: 123/84 113/73  Pulse: 91 94  Resp:  20  Temp:    SpO2: 94% 92%     General: Awake, no distress.  CV:  Good peripheral perfusion.  Normal tones and rate Resp:  Normal effort.   Clear lungs bilaterally.  No appreciable rales.  Normal work of breathing Abd:  No distention.  Nontender nondistended.  No obvious fluid wave or ascites.  There is appears to be mild edema of the anterior abdominal wall Other:  Bilateral lower extremities demonstrate moderate pitting edema bilaterally.  No unilateral noted discrepancies.  No venous cords or congestion.    ED Results / Procedures / Treatments   Labs (all labs ordered are listed, but only abnormal results are displayed) Labs Reviewed  BASIC METABOLIC PANEL WITH GFR - Abnormal; Notable for the following components:      Result Value   Potassium 3.2 (*)    Glucose, Bld 137 (*)    Calcium 8.1 (*)    All other components within normal limits  CBC - Abnormal; Notable for the following components:   RBC 5.16 (*)    HCT 46.9 (*)    All other components within normal limits  BRAIN NATRIURETIC PEPTIDE - Abnormal; Notable for the following components:   B Natriuretic  Peptide 1,570.7 (*)    All other components within normal limits  HEPATIC FUNCTION PANEL - Abnormal; Notable for the following components:   Total Protein 5.6 (*)    Albumin 3.1 (*)    All other components within normal limits  MAGNESIUM - Abnormal; Notable for the following components:   Magnesium 1.6 (*)    All other components within normal limits  PROCALCITONIN  PROTEIN / CREATININE RATIO, URINE  TSH  MICROALBUMIN / CREATININE URINE RATIO  HEMOGLOBIN A1C  LACTIC ACID, PLASMA     EKG  And interpreted by me at 9:35 AM Heart rate 110 QRS 80 QTc 490 Sinus tachycardia, right bundle branch block morphology.  Occasional PVC.  Very mild nonspecific T wave abnormality.  No evidence of acute frank ischemic abnormality   RADIOLOGY  Chest x-ray interpreted by me as mild to moderate bilateral pleural effusions, vascular congestion  US  ABDOMEN LIMITED RUQ (LIVER/GB) Result Date: 08/20/2023 CLINICAL DATA:  [id number] Abdominal pain [id number]. EXAM: ULTRASOUND  ABDOMEN LIMITED RIGHT UPPER QUADRANT COMPARISON:  None Available. FINDINGS: Gallbladder: Surgically absent. Common bile duct: Diameter: 4-7 mm.  No intrahepatic bile duct dilation. Liver: No focal lesion identified. Within normal limits in parenchymal echogenicity. Portal vein is patent on color Doppler imaging with normal direction of blood flow towards the liver. Other: Incidentally noted is right pleural effusion. IMPRESSION: *Surgically absent gallbladder. No significant sonographic abnormality of the liver or bile ducts. *Right pleural effusion. Electronically Signed   By: Beula Brunswick M.D.   On: 08/20/2023 11:45   DG HIP UNILAT WITH PELVIS 2-3 VIEWS RIGHT Result Date: 08/20/2023 CLINICAL DATA:  Right hip pain EXAM: DG HIP (WITH OR WITHOUT PELVIS) 2-3V RIGHT COMPARISON:  None Available. FINDINGS: There is no evidence of hip fracture or dislocation. There is no evidence of arthropathy or other focal bone abnormality. IMPRESSION: Negative. Electronically Signed   By: Fredrich Jefferson M.D.   On: 08/20/2023 11:00   DG Chest 2 View Result Date: 08/20/2023 CLINICAL DATA:  Shortness of breath EXAM: CHEST - 2 VIEW COMPARISON:  December 05, 2018 FINDINGS: Bilateral basilar infiltrates and atelectasis with a small bilateral pleural effusions or reactions without consolidations. Heart and mediastinum normal IMPRESSION: Basilar infiltrates and atelectasis Electronically Signed   By: Fredrich Jefferson M.D.   On: 08/20/2023 10:16         PROCEDURES:  Critical Care performed: No  Procedures   MEDICATIONS ORDERED IN ED: Medications  sodium chloride  flush (NS) 0.9 % injection 3 mL (has no administration in time range)  sodium chloride  flush (NS) 0.9 % injection 3 mL (has no administration in time range)  0.9 %  sodium chloride  infusion (has no administration in time range)  acetaminophen  (TYLENOL ) tablet 650 mg (has no administration in time range)  ondansetron  (ZOFRAN ) injection 4 mg (has no administration in  time range)  enoxaparin  (LOVENOX ) injection 40 mg (has no administration in time range)  furosemide (LASIX) injection 40 mg (has no administration in time range)  potassium chloride  SA (KLOR-CON  M) CR tablet 40 mEq (has no administration in time range)  potassium chloride  SA (KLOR-CON  M) CR tablet 40 mEq (40 mEq Oral Given 08/20/23 1033)  furosemide (LASIX) injection 20 mg (20 mg Intravenous Given 08/20/23 1038)     IMPRESSION / MDM / ASSESSMENT AND PLAN / ED COURSE  I reviewed the triage vital signs and the nursing notes.  Differential diagnosis includes, but is not limited to, volume overload CHF, anasarca third spacing, nephropathy etc.  Patient also reports chronic right hip pain worsens during the day with reassuring imaging.  Very reassuring exam I doubt acute internal derangement or septic type joint  Patient's presentation is most consistent with acute complicated illness / injury requiring diagnostic workup.   Based on her imaging today reassuring right upper quadrant ultrasound, workup BNP being elevated and her chest x-ray suspicious the patient has new onset CHF.  Discussed with Dr. Von Grumbling of cardiology, and after discussing the case with patient's clinical status clinical history, as well as some ectopy noted on ECG including frequent PVCs cardiology recommendation is to have the patient admitted for further workup of what appears to be concern for new onset CHF  Patient understand agreeable with plan for admission.  Consulted with and patient accepted to hospitalist by Dr. Guss Legacy       FINAL CLINICAL IMPRESSION(S) / ED DIAGNOSES   Final diagnoses:  Anasarca     Rx / DC Orders   ED Discharge Orders     None        Note:  This document was prepared using Dragon voice recognition software and may include unintentional dictation errors.   Iver Marker, MD 08/20/23 [id number]

## 2023-08-20 NOTE — Consult Note (Signed)
 Cardiology Consultation   Patient ID: ANECIA Welch MRN: [id number]; DOB: February 15, 1966  Admit date: 08/20/2023 Date of Consult: 08/20/2023  PCP:  Vicente Graham No   [facility] Providers Cardiologist:  None        Patient Profile:   Martha Welch is a 58 y.o. female with a hx of UTI, nephrolithiasis, pyelonephritis, and hypokalemia who is being seen 08/20/2023 for the evaluation of lower extremity swelling at the request of Dr. Valetta Gaudy.  History of Present Illness:   Ms. Martha Welch is accompanied by her husband who assists with history. She reports that she was sick with cold-like symptoms 02/2023 and has not felt quite herself since then.  She reports intermittent lower extremity swelling for the past several months. She also notes increased fatigue. She also reports that she has had trouble eating, becoming full after eating very little. Despite not eating much, she estimates that she has gained 5 to 10 pounds in the past few weeks. Her husband also notes that she often coughs during the night. She has noticed worsening and persistent swelling for the past several weeks in her lower extremities and abdomen. This morning when she arrived to work her boss noticed that her face looked more swollen than normal and recommended that she go to the emergency room. She stays busy working 60 hours/week. She denies chest pain and shortness of breath with exertion. She denies orthopnea, PND, lightheadedness, dizziness, fever, nausea, vomiting, diarrhea, and bleeding. She reports drinking alcohol daily, 2-3 "swigs" of whiskey. She also endorses tobacco use, 1/2 pack/day since she was 58 years old. She reports pertinent family history including a father with heart disease and brother with CAD s/p CABG x 5 in his 65s. She denies any personal history of heart disease and has never seen a cardiologist. The only medications that she takes on a daily basis are supplemental potassium and diphenhydramine  for  sleep.  In the ED, vitals were normal. Labs showed hypokalemia 3.2, with otherwise unremarkable CMP and CBC. BNP was moderately elevated at 1570. EKG showed sinus rhythm with frequent PVCs. Chest x-ray showed basilar infiltrates and atelectasis with small bilateral pleural effusions. Patient was given IV Lasix 20 mg with -600 mL output recorded. Cardiology was asked to consult for further evaluation of swelling and possible heart failure.  Past Medical History:  Diagnosis Date   Anemia     Past Surgical History:  Procedure Laterality Date   ABDOMINAL HYSTERECTOMY     CESAREAN SECTION     CHOLECYSTECTOMY     CYSTOSCOPY WITH STENT PLACEMENT Left 02/09/2022   Procedure: CYSTOSCOPY WITH STENT PLACEMENT;  Surgeon: Geraline Knapp, MD;  Location: [facility] ORS;  Service: Urology;  Laterality: Left;   CYSTOSCOPY/URETEROSCOPY/HOLMIUM LASER/STENT PLACEMENT Left 02/09/2022   Procedure: CYSTOSCOPY/URETEROSCOPY/HOLMIUM LASER/STENT PLACEMENT;  Surgeon: Geraline Knapp, MD;  Location: [facility] ORS;  Service: Urology;  Laterality: Left;       Inpatient Medications: Scheduled Meds:  enoxaparin  (LOVENOX ) injection  40 mg Subcutaneous Q24H   [START ON 08/21/2023] furosemide  40 mg Intravenous Daily   sodium chloride  flush  3 mL Intravenous Q12H   Continuous Infusions:  sodium chloride      PRN Meds: sodium chloride , acetaminophen , ondansetron  (ZOFRAN ) IV, sodium chloride  flush  Allergies:    Allergies  Allergen Reactions   Amoxicillin  Other (See Comments)    Yeast infection    Social History:   Social History   Socioeconomic History   Marital status: Married    Spouse name:  Not on file   Number of children: Not on file   Years of education: Not on file   Highest education level: Not on file  Occupational History   Not on file  Tobacco Use   Smoking status: Every Day    Current packs/day: 1.50    Types: Cigarettes   Smokeless tobacco: Never  Vaping Use   Vaping status: Never Used   Substance and Sexual Activity   Alcohol use: Yes    Comment: socially    Drug use: No   Sexual activity: Yes  Other Topics Concern   Not on file  Social History Narrative   Not on file   Social Drivers of Health   Financial Resource Strain: Not on file  Food Insecurity: No Food Insecurity (02/08/2022)   Hunger Vital Sign    Worried About Running Out of Food in the Last Year: Never true    Ran Out of Food in the Last Year: Never true  Transportation Needs: No Transportation Needs (02/08/2022)   PRAPARE - Administrator, Civil Service (Medical): No    Lack of Transportation (Non-Medical): No  Physical Activity: Not on file  Stress: Not on file  Social Connections: Not on file  Intimate Partner Violence: Not At Risk (02/08/2022)   Humiliation, Afraid, Rape, and Kick questionnaire    Fear of Current or Ex-Partner: No    Emotionally Abused: No    Physically Abused: No    Sexually Abused: No    Family History:   Family History  Problem Relation Age of Onset   Stomach cancer Mother    Heart disease Father    Heart attack Brother    Coronary artery disease Brother 31 - 32       CABG     ROS:  Please see the history of present illness.   Physical Exam/Data:   Vitals:   08/20/23 0929 08/20/23 1000 08/20/23 1030 08/20/23 1130  BP:  121/67 124/77 114/77  Pulse:  90 88 96  Resp:  20  18  Temp:      SpO2:  93% 92% 95%  Weight: 66.7 kg     Height: 5\' 3"  (1.6 m)       Intake/Output Summary (Last 24 hours) at 08/20/2023 1312 Last data filed at 08/20/2023 1157 Gross per 24 hour  Intake --  Output 600 ml  Net -600 ml      08/20/2023    9:29 AM 02/21/2022    8:02 AM 02/09/2022    3:00 PM  Last 3 Weights  Weight (lbs) 147 lb 132 lb 132 lb  Weight (kg) 66.679 kg 59.875 kg 59.875 kg     Body mass index is 26.04 kg/m.  General:  Well nourished, well developed, in no acute distress HEENT: normal Neck: + JVD Vascular: No carotid bruits; Distal pulses 2+  bilaterally Cardiac:  normal S1, S2; RRR; no murmur  Lungs: Reduced at the bases with faint expiratory wheezing Abd: distended and firm, nontender, no hepatomegaly  Ext: 1-2+ bilateral LE pitting edema Skin: warm and dry  Psych:  Normal affect   EKG:  The EKG was personally reviewed and demonstrates:  sinus rhythm with frequent PVCs, low voltage lateral leads, possible anterior infarct Telemetry:  Telemetry was personally reviewed and demonstrates: sinus rhythm with frequent PVCs  Relevant CV Studies:  Echo pending  Laboratory Data:  High Sensitivity Troponin:  No results for input(s): "TROPONINIHS" in the last 720 hours.   Chemistry Recent  Labs  Lab 08/20/23 0933  NA 142  K 3.2*  CL 108  CO2 25  GLUCOSE 137*  BUN 10  CREATININE 0.95  CALCIUM 8.1*  GFRNONAA >60  ANIONGAP 9    Recent Labs  Lab 08/20/23 0933  PROT 5.6*  ALBUMIN 3.1*  AST 22  ALT 19  ALKPHOS 92  BILITOT 0.8   Lipids No results for input(s): "CHOL", "TRIG", "HDL", "LABVLDL", "LDLCALC", "CHOLHDL" in the last 168 hours.  Hematology Recent Labs  Lab 08/20/23 0933  WBC 9.6  RBC 5.16*  HGB 14.9  HCT 46.9*  MCV 90.9  MCH 28.9  MCHC 31.8  RDW 14.3  PLT 156   Thyroid No results for input(s): "TSH", "FREET4" in the last 168 hours.  BNP Recent Labs  Lab 08/20/23 0933  BNP 1,570.7*    DDimer No results for input(s): "DDIMER" in the last 168 hours.   Radiology/Studies:  US  ABDOMEN LIMITED RUQ (LIVER/GB) Result Date: 08/20/2023 IMPRESSION: *Surgically absent gallbladder. No significant sonographic abnormality of the liver or bile ducts. *Right pleural effusion. Electronically Signed   By: Beula Brunswick M.D.   On: 08/20/2023 11:45   DG HIP UNILAT WITH PELVIS 2-3 VIEWS RIGHT Result Date: 08/20/2023 IMPRESSION: Negative. Electronically Signed   By: Fredrich Jefferson M.D.   On: 08/20/2023 11:00   DG Chest 2 View Result Date: 08/20/2023 IMPRESSION: Basilar infiltrates and atelectasis Electronically  Signed   By: Fredrich Jefferson M.D.   On: 08/20/2023 10:16   Assessment and Plan:   Acute heart failure LE edema - Presented 5/6 with worsening lower extremity and abdominal swelling for the past several months with weight gain - Unclear etiology of heart failure - BNP 1570 - CXR shows small bilateral pleural effusions - Given IV Lasix 20 mg in the ED with -600 mL output recorded - Continue diuresis with IV Lasix 40 mg twice daily - Monitor kidney function, daily weights, and strict I/Os with ongoing diuresis - Echo ordered, further recommendations pending results - Urine studies to rule out nephrotic syndrome  Frequent PVCs - Noted on EKG and telemetry - No BB at this time given unknown EF  Hypokalemia - Patient reports chronic hypokalemia - Recommend repletion for K > 4  For questions or updates, please contact [facility] Please consult [email or web address] for contact info under    Signed, Brodie Cannon, PA-C  08/20/2023 1:12 PM

## 2023-08-20 NOTE — Assessment & Plan Note (Addendum)
 Frequent PVCs noted on EKG and telemetry, although asymptomatic.  Unclear if this is contributing to acute heart failure, or secondary to CHF.  - Telemetry monitoring - Hold off on beta-blockers pending cardiology evaluation given hypervolemia with unknown EF

## 2023-08-20 NOTE — Progress Notes (Signed)
 Pt got transferred from [facility] ED to [id number]-BB. She has been alert and fully oriented x 4, no obvious acute distress, denied chest pain at arrival. Stable hemodynamically, afebrile, on room air, normal respiratory effort.   We called CCMD with the 2nd person verified with NSR on the monitor. Later the CCMD notified that Pt had short 9 beats VT while Pt was asymptomatically sleeping comfortably. No distress. We will continue to monitor.   08/20/23 1938  Vitals  Temp 97.8 F (36.6 C)  Temp Source Oral  BP 122/64  MAP (mmHg) 83  BP Location Left Arm  BP Method Automatic  Patient Position (if appropriate) Lying  Pulse Rate 93  Pulse Rate Source Monitor  ECG Heart Rate 92  Resp 18  Level of Consciousness  Level of Consciousness Alert  MEWS COLOR  MEWS Score Color Green  Oxygen Therapy  SpO2 98 %  O2 Device Room Air  Pain Assessment  Pain Scale 0-10  Pain Score 0   Brain Cahill, RN

## 2023-08-20 NOTE — Assessment & Plan Note (Signed)
 Last A1c of 5.8% approximately 1 year ago.  - Repeat A1c pending

## 2023-08-20 NOTE — ED Notes (Signed)
 Pt alert, NAD, calm,interactive, resps e/u, speaking in clear complete sentences. Deniespain, sob, nausea or dizziness.  States, "feel normal, no complaints". Family at [facility].

## 2023-08-21 ENCOUNTER — Other Ambulatory Visit (HOSPITAL_COMMUNITY): Payer: Self-pay

## 2023-08-21 DIAGNOSIS — I5041 Acute combined systolic (congestive) and diastolic (congestive) heart failure: Secondary | ICD-10-CM

## 2023-08-21 DIAGNOSIS — I420 Dilated cardiomyopathy: Secondary | ICD-10-CM

## 2023-08-21 LAB — HEMOGLOBIN A1C
Hgb A1c MFr Bld: 5.6 % (ref 4.8–5.6)
Mean Plasma Glucose: 114.02 mg/dL

## 2023-08-21 LAB — ECHOCARDIOGRAM COMPLETE
AR max vel: 1.57 cm2
AV Area VTI: 1.56 cm2
AV Area mean vel: 1.52 cm2
AV Mean grad: 3.7 mmHg
AV Peak grad: 6.2 mmHg
Ao pk vel: 1.25 m/s
Area-P 1/2: 4.96 cm2
Height: 63 in
S' Lateral: 5.4 cm
Weight: 2249.6 [oz_av]

## 2023-08-21 LAB — MICROALBUMIN / CREATININE URINE RATIO
Creatinine, Urine: 5.7 mg/dL
Microalb, Ur: 23.3 ug/mL — ABNORMAL HIGH

## 2023-08-21 LAB — BASIC METABOLIC PANEL WITH GFR
Anion gap: 8 (ref 5–15)
BUN: 10 mg/dL (ref 6–20)
CO2: 28 mmol/L (ref 22–32)
Calcium: 8.1 mg/dL — ABNORMAL LOW (ref 8.9–10.3)
Chloride: 106 mmol/L (ref 98–111)
Creatinine, Ser: 0.84 mg/dL (ref 0.44–1.00)
GFR, Estimated: >60 mL/min
Glucose, Bld: 101 mg/dL — ABNORMAL HIGH (ref 70–99)
Potassium: 3.5 mmol/L (ref 3.5–5.1)
Sodium: 142 mmol/L (ref 135–145)

## 2023-08-21 LAB — LIPID PANEL
Cholesterol: 97 mg/dL (ref 0–200)
HDL: 34 mg/dL — ABNORMAL LOW (ref >40)
LDL Cholesterol: 49 mg/dL (ref 0–99)
Total CHOL/HDL Ratio: 2.9 ratio
Triglycerides: 68 mg/dL (ref <150)
VLDL: 14 mg/dL (ref 0–40)

## 2023-08-21 LAB — HIV ANTIBODY (ROUTINE TESTING W REFLEX): HIV Screen 4th Generation wRfx: NONREACTIVE

## 2023-08-21 LAB — MAGNESIUM: Magnesium: 1.6 mg/dL — ABNORMAL LOW (ref 1.7–2.4)

## 2023-08-21 MED ORDER — MAGNESIUM SULFATE 2 GM/50ML IV SOLN
2.0000 g | Freq: Once | INTRAVENOUS | Status: AC
  Administered 2023-08-21: 2 g via INTRAVENOUS
  Filled 2023-08-21: qty 50

## 2023-08-21 MED ORDER — SODIUM CHLORIDE 0.9 % IV SOLN: INTRAVENOUS | Status: DC

## 2023-08-21 MED ORDER — ASPIRIN 81 MG PO CHEW
81.0000 mg | CHEWABLE_TABLET | ORAL | Status: AC
  Administered 2023-08-22: 81 mg via ORAL
  Filled 2023-08-21: qty 1

## 2023-08-21 MED ORDER — POTASSIUM CHLORIDE CRYS ER 20 MEQ PO TBCR
40.0000 meq | EXTENDED_RELEASE_TABLET | Freq: Once | ORAL | Status: AC
  Administered 2023-08-21: 40 meq via ORAL
  Filled 2023-08-21: qty 2

## 2023-08-21 NOTE — Progress Notes (Signed)
 Progress Note   Patient: Martha Welch:[id number] DOB: 04/21/1965 DOA: 08/20/2023     1 DOS: the patient was seen and examined on 08/21/2023   Brief hospital course: From HPI "Martha Welch is a 58 y.o. female with medical history significant of recurrent nephrolithiasis, who presents to the ED due to swelling.   Martha Welch Welch that for the last 5 months, she has been experiencing gradually worsening lower extremity swelling.  During this time, she is also experienced increased hip and shin pain.  Then in the last 1 to 2 weeks, she has noticed swelling of her abdomen to the point that she was unable to buckle her pants earlier today.  She is also experiencing facial swelling especially around the eyes.  She denies any orthopnea, however her husband at bedside Welch that she coughs very heavily at night when she lays flat.     Martha Welch that it seems all her symptoms began after she had a severe cold back in November 2024 and she never recovered afterwards.   ED course: On arrival to the ED, patient was normotensive at 121/67 with heart rate of 88.  She was saturating at 93% on room air.  She was afebrile 97.9.  Initial workup notable for unremarkable CBC, potassium 3.2, creatinine 0.95, albumin 3.1 and GFR above 60.  BNP 1570.  Chest x-ray with bilateral infiltrates.  Right upper quadrant ultrasound with right pleural effusion but no abnormality of the liver or bile ducts.  Patient started on Lasix, potassium, and cardiology was consulted.  [facility] contacted for admission.  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 A1c of 5.6   Advance Care Planning:   Code Status: Full Code    Consults: Cardiology   Subjective:  Patient seen and examined at bedside this morning Denies nausea vomiting abdominal pain chest pain or cough  Physical Exam:  Appearance: She is normal weight.  HENT:     Head: Normocephalic and atraumatic.  Eyes:     Conjunctiva/sclera: Conjunctivae normal.     Pupils: Pupils are equal, round, and reactive to light.  Cardiovascular:     Rate and Rhythm: Normal rate and regular rhythm.     Heart sounds: No murmur heard. Pulmonary: Air entry decreased at the bases with mild crackles Abdominal:     General: There is distension.     Tenderness: There is no abdominal tenderness. There is no guarding.  Musculoskeletal: Bilateral lower extremity pitting edema Neurological:     General: No focal deficit present.     Mental Status: She is alert and oriented to person, place, and time.  Psychiatric:        Mood and Affect: Mood normal.        Behavior: Behavior normal.    Family Communication: Discussed with patient's husband present at bedside  Disposition: Hopefully home when medically stable Status is: Inpatient   Time spent: 55 minutes  Data Reviewed: I have reviewed abdominal ultrasound that did not show any  significant abnormality Chest x-ray showed bibasilar atelectasis    Latest Ref Rng & Units 08/20/2023    9:33 AM 02/16/2022    9:40 AM 02/15/2022    5:36 AM  CBC  WBC 4.0 - 10.5 K/uL 9.6  13.9  13.6   Hemoglobin 12.0 - 15.0 g/dL 09.8  11.9  14.7   Hematocrit 36.0 - 46.0 % 46.9  39.3  40.3   Platelets 150 - 400 K/uL 156  319  305        Latest Ref Rng & Units 08/21/2023    3:13 AM 08/20/2023    9:33 AM 02/16/2022    9:40 AM  BMP  Glucose 70 - 99 mg/dL 829  562  130   BUN 6 - 20 mg/dL 10  10  9     Creatinine 0.44 - 1.00 mg/dL 8.65  7.84  6.96   Sodium 135 - 145 mmol/L 142  142  145   Potassium 3.5 - 5.1 mmol/L 3.5  3.2  3.4   Chloride 98 - 111 mmol/L 106  108  109   CO2 22 - 32 mmol/L 28  25  26    Calcium 8.9 - 10.3 mg/dL 8.1  8.1  8.4     Vitals:   08/21/23 0755 08/21/23 1112 08/21/23 1232 08/21/23 1515  BP:  (!) 87/51 (!) 104/58 99/61  Pulse:  68 72 77  Resp:      Temp:  97.8 F (36.6 C)  98.1 F (36.7 C)  TempSrc:      SpO2: 93% 91%  93%  Weight:      Height:         Author: Ezzard Holms, MD 08/21/2023 5:32 PM  For on call review [email or web address].

## 2023-08-21 NOTE — Progress Notes (Addendum)
 Heart Failure Stewardship Pharmacy Note  PCP: Pcp, No PCP-Cardiologist: Sammy Crisp, MD  HPI: Martha Welch is a 58 y.o. female with history of nephrolithiasis, UTIs, HFrEF, and chronic hypokalemia who presented with progressively worsening edema. On admission, BNP was 1570.7 and lactic acid 1.0. Chest x-ray showed small bilateral pleural effusions and atelectasis. ECHO 08/20/2023 showed LVEF 25-30% with severely decreased left ventricle function, severely dilated left ventricular internal cavity size, mild RV systolic function reduction, and moderate mitral valve regurgitation. Cardiology planning heart catheterization 08/22/23.  Pertinent Lab Values: Creatinine, Ser  Date Value Ref Range Status  08/21/2023 0.84 0.44 - 1.00 mg/dL Final   BUN  Date Value Ref Range Status  08/21/2023 10 6 - 20 mg/dL Final   Potassium  Date Value Ref Range Status  08/21/2023 3.5 3.5 - 5.1 mmol/L Final   Sodium  Date Value Ref Range Status  08/21/2023 142 135 - 145 mmol/L Final   B Natriuretic Peptide  Date Value Ref Range Status  08/20/2023 1,570.7 (H) 0.0 - 100.0 pg/mL Final    Comment:    Performed at [facility], [address] [place], [place] [address]   Magnesium  Date Value Ref Range Status  08/21/2023 1.6 (L) 1.7 - 2.4 mg/dL Final    Comment:    Performed at [facility], [address] [place], [place] [address]   Hgb A1c MFr Bld  Date Value Ref Range Status  08/20/2023 5.6 4.8 - 5.6 % Final    Comment:    (NOTE) Pre diabetes:          5.7%-6.4%  Diabetes:              >6.4%  Glycemic control for   <7.0% adults with diabetes    TSH  Date Value Ref Range Status  08/20/2023 3.773 0.350 - 4.500 uIU/mL Final    Comment:    Performed by a 3rd Generation assay with a functional sensitivity of <=0.01 uIU/mL. Performed at [facility], [address] [place], [place] [address]     Vital Signs: Admission weight:147 lbs Temp:  [97.8 F  (36.6 C)-98.3 F (36.8 C)] 97.8 F (36.6 C) (05/07 1112) Pulse Rate:  [68-98] 72 (05/07 1232) Cardiac Rhythm: Normal sinus rhythm (05/07 0700) Resp:  [16-25] 18 (05/07 0355) BP: (87-125)/(51-83) 104/58 (05/07 1232) SpO2:  [89 %-98 %] 91 % (05/07 1112) Weight:  [62.3 kg (137 lb 4.8 oz)-63.8 kg (140 lb 9.6 oz)] 62.3 kg (137 lb 4.8 oz) (05/07 0535)  Intake/Output Summary (Last 24 hours) at 08/21/2023 1519 Last data filed at 08/21/2023 0600 Gross per 24 hour  Intake 500 ml  Output 1500 ml  Net -1000 ml   Current Heart Failure Medications:  Loop diuretic: furosemide 40 mg IV BID Beta-Blocker: none ACEI/ARB/ARNI: none MRA: spironolactone 25 mg daily  SGLT2i: none Other: none   Prior to admission Heart Failure Medications:  Loop diuretic: none Beta-Blocker: none ACEI/ARB/ARNI: none MRA: none SGLT2i: none Other: none  Assessment: 1. Acute on chronic systolic heart failure (LVEF 25-30%) with mildly reduced RV function, due to NICM. NYHA class II-III symptoms.  -Symptoms: Patient reports LE edema and SOB has improved but she still has some abdominal edema. Denies dyspnea on exertion or orthopnea.  -Volume: Patient may still have mild volume overloaded. Weight down ~3 pounds since admission. Creatinine and BUN stable. Continue furosemide 40 mg IV BID. Can adjust pending volume status seen on cath tomorrow. -Hemodynamics: BP normal. HR 80-90s.  -BB:  Can consider adding once patient is euvolemic.  -ACEI/ARB/ARNI: Can consider adding losartan 12.5 mg daily after cath. -MRA: Continue spironolactone 25 mg daily.  -SGLT2i:Not a candidate given recurrent UTI history.   Plan: 1) Medication changes recommended at this time: - None  2) Patient assistance: - Pending   3) Education: -To be completed prior to discharge. - Patient has been educated on current HF medications and potential additions to HF medication regimen - Patient verbalizes understanding that over the next few months,  these medication doses may change and more medications may be added to optimize HF regimen - Patient has been educated on basic disease state pathophysiology and goals of therapy   Medication Assistance / Insurance Benefits Check: Does the patient have prescription insurance?  No  Type of insurance plan:  Does the patient qualify for medication assistance through manufacturers or grants? Pending  Outpatient Pharmacy: Prior to admission outpatient pharmacy: [facility]    Please do not hesitate to reach out with questions or concerns,  Bevely Brush, PharmD, CPP, BCPS Heart Failure Pharmacist  Phone - [phone] 08/21/2023 3:35 PM

## 2023-08-21 NOTE — Progress Notes (Signed)
 Rounding Note    Patient Name: Martha Welch Date of Encounter: 08/21/2023  [facility] Cardiologist: None   Subjective   Patient reports good urine output. Weight is down 10 lbs since admission. Kidney function stable. Echo done yesterday shows EF 25-30% with global hypokinesis.   Inpatient Medications    Scheduled Meds:  enoxaparin  (LOVENOX ) injection  40 mg Subcutaneous Q24H   furosemide  40 mg Intravenous BID   sodium chloride  flush  3 mL Intravenous Q12H   spironolactone  25 mg Oral Daily   Continuous Infusions:  sodium chloride      PRN Meds: sodium chloride , acetaminophen , ondansetron  (ZOFRAN ) IV, sodium chloride  flush   Vital Signs    Vitals:   08/21/23 0355 08/21/23 0535 08/21/23 0749 08/21/23 0755  BP: 111/83  111/62   Pulse: 79  77   Resp: 18     Temp: 97.8 F (36.6 C)  97.8 F (36.6 C)   TempSrc: Oral     SpO2: 93%  (!) 89% 93%  Weight:  62.3 kg    Height:        Intake/Output Summary (Last 24 hours) at 08/21/2023 0824 Last data filed at 08/21/2023 0600 Gross per 24 hour  Intake 500 ml  Output 2800 ml  Net -2300 ml      08/21/2023    5:35 AM 08/20/2023    7:38 PM 08/20/2023    9:29 AM  Last 3 Weights  Weight (lbs) 137 lb 4.8 oz 140 lb 9.6 oz 147 lb  Weight (kg) 62.279 kg 63.776 kg 66.679 kg      Telemetry    Sinus rhythm with frequent PVCs- Personally Reviewed  Physical Exam   GEN: No acute distress.   Neck: + JVD Cardiac: RRR, no murmurs, rubs, or gallops.  Respiratory: Faint crackles in the bases.  GI: Soft, nontender, non-distended  MS: 1+ LE edema; No deformity. Neuro:  Nonfocal  Psych: Normal affect   Labs    High Sensitivity Troponin:  No results for input(s): "TROPONINIHS" in the last 720 hours.   Chemistry Recent Labs  Lab 08/20/23 0933 08/21/23 0313  NA 142 142  K 3.2* 3.5  CL 108 106  CO2 25 28  GLUCOSE 137* 101*  BUN 10 10  CREATININE 0.95 0.84  CALCIUM 8.1* 8.1*  MG 1.6* 1.6*  PROT 5.6*  --    ALBUMIN 3.1*  --   AST 22  --   ALT 19  --   ALKPHOS 92  --   BILITOT 0.8  --   GFRNONAA >60 >60  ANIONGAP 9 8    Lipids No results for input(s): "CHOL", "TRIG", "HDL", "LABVLDL", "LDLCALC", "CHOLHDL" in the last 168 hours.  Hematology Recent Labs  Lab 08/20/23 0933  WBC 9.6  RBC 5.16*  HGB 14.9  HCT 46.9*  MCV 90.9  MCH 28.9  MCHC 31.8  RDW 14.3  PLT 156   Thyroid  Recent Labs  Lab 08/20/23 0933  TSH 3.773    BNP Recent Labs  Lab 08/20/23 0933  BNP 1,570.7*    DDimer No results for input(s): "DDIMER" in the last 168 hours.   Radiology    US  ABDOMEN LIMITED RUQ (LIVER/GB) Result Date: 08/20/2023 IMPRESSION: *Surgically absent gallbladder. No significant sonographic abnormality of the liver or bile ducts. *Right pleural effusion. Electronically Signed   By: Beula Brunswick M.D.   On: 08/20/2023 11:45   DG HIP UNILAT WITH PELVIS 2-3 VIEWS RIGHT Result Date: 08/20/2023 IMPRESSION:  Negative. Electronically Signed   By: Fredrich Jefferson M.D.   On: 08/20/2023 11:00   DG Chest 2 View Result Date: 08/20/2023 IMPRESSION: Basilar infiltrates and atelectasis Electronically Signed   By: Fredrich Jefferson M.D.   On: 08/20/2023 10:16   Cardiac Studies   08/20/2023 Echo complete 1. Left ventricular ejection fraction, by estimation, is 25 to 30%. The  left ventricle has severely decreased function. The left ventricle  demonstrates global hypokinesis. The left ventricular internal cavity size  was severely dilated. There is mild  left ventricular hypertrophy. Left ventricular diastolic parameters are  consistent with Grade II diastolic dysfunction (pseudonormalization).  Elevated left atrial pressure.   2. Right ventricular systolic function is mildly reduced. The right  ventricular size is normal. There is mildly elevated pulmonary artery  systolic pressure.   3. Left atrial size was moderately dilated.   4. A small pericardial effusion is present. The pericardial effusion is   circumferential.   5. The mitral valve is degenerative. Moderate mitral valve regurgitation.  No evidence of mitral stenosis.   6. Tricuspid valve regurgitation is moderate.   7. The aortic valve is tricuspid. Aortic valve regurgitation is not  visualized. No aortic stenosis is present.   8. The inferior vena cava is normal in size with <50% respiratory  variability, suggesting right atrial pressure of 8 mmHg.   Patient Profile     58 y.o. female with a history of UTI, nephrolithiasis, pyelonephritis and hypokalemia who presented 5/6 for progressive LE swelling with newly diagnosed HFrEF.   Assessment & Plan    Newly diagnosed HFrEF - Presented 5/6 with lower extremity and abdominal swelling for the past several months with weight gain - BNP 1579  - Chest x-ray showing small bilateral pleural effusions - Echo done yesterday showed EF 25 to 30% with global hypokinesis, mild LVH, G2 DD, mildly reduced RV function, mildly elevated pulmonary artery systolic pressure, moderately dilated left atrial size, small pericardial effusion, moderate MR, and right atrial pressure of 8 mmHg - Initially started on IV Lasix 20 mg, transition to IV Lasix 40 mg twice daily - Kidney function stable - Weight is down 10 pounds since admission, volume status improving on exam - Continue to monitor kidney function, daily weights, and strict I's and O's with ongoing diuresis - Started on spironolactone 25 mg daily yesterday - Given unclear etiology of HFrEF, plan to proceed with left and right heart catheterization tentatively scheduled for tomorrow afternoon, n.p.o. after midnight - BP borderline hypotensive, will defer addition of ACEi/ARB/Arni at this time - Not a good candidate for SGLT2 inhibitor due to recurrent UTI - Consider addition of BB once patient is euvolemic - Will continue to progress GDMT as BP/kidney function allows  Frequent PVCs - Noted on EKG and telemetry - Consider addition of BB as  above  Hypokalemia - Chronic hypokalemia, possibly in the setting of alcohol use - Continue to replete for K > 4  For questions or updates, please contact [facility] Please consult [email or web address] for contact info under        Signed, Brodie Cannon, PA-C  08/21/2023, 8:24 AM

## 2023-08-21 NOTE — Progress Notes (Signed)
 PHARMACY CONSULT NOTE - FOLLOW UP  Pharmacy Consult for Electrolyte Monitoring and Replacement   Recent Labs: Potassium (mmol/L)  Date Value  08/21/2023 3.5   Magnesium (mg/dL)  Date Value  25/36/6440 1.6 (L)   Calcium (mg/dL)  Date Value  34/74/2595 8.1 (L)   Albumin (g/dL)  Date Value  63/87/5643 3.1 (L)   Sodium (mmol/L)  Date Value  08/21/2023 142     Assessment: Patient is a 58yo female presenting with increased swelling with hx of nephrolithiasis UTIs.  Pharmacy consulted for electrolyte management.  mIVF: sodium chloride  flush 0.9% injection 3mL IV q12h  Pertinent medications: furosemide 40mg  IV 2 times daily, spironolactone 25mg  daily  Goal of Therapy:  Maintain electrolytes within range  Plan: - K 3.5 >> want to aim for 4.0 while on diuretic, potassium chloride  40mEq PO x 1 - Mag 1.6 >> magnesium sulfate 2g IV x 1 - Follow up on AM labs  Ara Knee, PharmD Candidate 08/21/2023 9:25 AM

## 2023-08-22 ENCOUNTER — Encounter
Admission: EM | Disposition: A | Discharge status: Home / Self Care | Payer: Self-pay | Source: Home / Self Care | Attending: Internal Medicine

## 2023-08-22 ENCOUNTER — Encounter: Payer: Self-pay | Admitting: Internal Medicine

## 2023-08-22 DIAGNOSIS — I5023 Acute on chronic systolic (congestive) heart failure: Secondary | ICD-10-CM

## 2023-08-22 HISTORY — PX: RIGHT/LEFT HEART CATH AND CORONARY ANGIOGRAPHY: CATH118266

## 2023-08-22 LAB — BASIC METABOLIC PANEL WITH GFR
Anion gap: 11 (ref 5–15)
BUN: 10 mg/dL (ref 6–20)
CO2: 28 mmol/L (ref 22–32)
Calcium: 8.4 mg/dL — ABNORMAL LOW (ref 8.9–10.3)
Chloride: 102 mmol/L (ref 98–111)
Creatinine, Ser: 0.77 mg/dL (ref 0.44–1.00)
GFR, Estimated: >60 mL/min (ref >60)
Glucose, Bld: 95 mg/dL (ref 70–99)
Potassium: 3.4 mmol/L — ABNORMAL LOW (ref 3.5–5.1)
Sodium: 141 mmol/L (ref 135–145)

## 2023-08-22 LAB — POCT I-STAT 7, (LYTES, BLD GAS, ICA,H+H)
Acid-Base Excess: 5 mmol/L — ABNORMAL HIGH (ref 0.0–2.0)
Bicarbonate: 30 mmol/L — ABNORMAL HIGH (ref 20.0–28.0)
Calcium, Ion: 1.09 mmol/L — ABNORMAL LOW (ref 1.15–1.40)
HCT: 44 % (ref 36.0–46.0)
Hemoglobin: 15 g/dL (ref 12.0–15.0)
O2 Saturation: 90 %
Potassium: 4.2 mmol/L (ref 3.5–5.1)
Sodium: 141 mmol/L (ref 135–145)
TCO2: 31 mmol/L (ref 22–32)
pCO2 arterial: 45 mmHg (ref 32–48)
pH, Arterial: 7.431 (ref 7.35–7.45)
pO2, Arterial: 58 mmHg — ABNORMAL LOW (ref 83–108)

## 2023-08-22 LAB — CBC WITH DIFFERENTIAL/PLATELET
Abs Immature Granulocytes: 0.03 10*3/uL (ref 0.00–0.07)
Basophils Absolute: 0 10*3/uL (ref 0.0–0.1)
Basophils Relative: 0 %
Eosinophils Absolute: 0.1 10*3/uL (ref 0.0–0.5)
Eosinophils Relative: 1 %
HCT: 45 % (ref 36.0–46.0)
Hemoglobin: 14.2 g/dL (ref 12.0–15.0)
Immature Granulocytes: 0 %
Lymphocytes Relative: 27 %
Lymphs Abs: 2.7 10*3/uL (ref 0.7–4.0)
MCH: 28.1 pg (ref 26.0–34.0)
MCHC: 31.6 g/dL (ref 30.0–36.0)
MCV: 88.9 fL (ref 80.0–100.0)
Monocytes Absolute: 0.6 10*3/uL (ref 0.1–1.0)
Monocytes Relative: 6 %
Neutro Abs: 6.7 10*3/uL (ref 1.7–7.7)
Neutrophils Relative %: 66 %
Platelets: 128 10*3/uL — ABNORMAL LOW (ref 150–400)
RBC: 5.06 MIL/uL (ref 3.87–5.11)
RDW: 14.1 % (ref 11.5–15.5)
WBC: 10.1 10*3/uL (ref 4.0–10.5)
nRBC: 0 % (ref 0.0–0.2)

## 2023-08-22 LAB — POCT I-STAT EG7
Acid-Base Excess: 6 mmol/L — ABNORMAL HIGH (ref 0.0–2.0)
Acid-Base Excess: 6 mmol/L — ABNORMAL HIGH (ref 0.0–2.0)
Bicarbonate: 32 mmol/L — ABNORMAL HIGH (ref 20.0–28.0)
Bicarbonate: 32.3 mmol/L — ABNORMAL HIGH (ref 20.0–28.0)
Calcium, Ion: 1.12 mmol/L — ABNORMAL LOW (ref 1.15–1.40)
Calcium, Ion: 1.13 mmol/L — ABNORMAL LOW (ref 1.15–1.40)
HCT: 45 % (ref 36.0–46.0)
HCT: 45 % (ref 36.0–46.0)
Hemoglobin: 15.3 g/dL — ABNORMAL HIGH (ref 12.0–15.0)
Hemoglobin: 15.3 g/dL — ABNORMAL HIGH (ref 12.0–15.0)
O2 Saturation: 59 %
O2 Saturation: 62 %
Potassium: 4.3 mmol/L (ref 3.5–5.1)
Potassium: 4.3 mmol/L (ref 3.5–5.1)
Sodium: 141 mmol/L (ref 135–145)
Sodium: 141 mmol/L (ref 135–145)
TCO2: 34 mmol/L — ABNORMAL HIGH (ref 22–32)
TCO2: 34 mmol/L — ABNORMAL HIGH (ref 22–32)
pCO2, Ven: 50.8 mmHg (ref 44–60)
pCO2, Ven: 51.6 mmHg (ref 44–60)
pH, Ven: 7.404 (ref 7.25–7.43)
pH, Ven: 7.407 (ref 7.25–7.43)
pO2, Ven: 31 mmHg — CL (ref 32–45)
pO2, Ven: 33 mmHg (ref 32–45)

## 2023-08-22 LAB — MAGNESIUM: Magnesium: 1.6 mg/dL — ABNORMAL LOW (ref 1.7–2.4)

## 2023-08-22 SURGERY — RIGHT/LEFT HEART CATH AND CORONARY ANGIOGRAPHY
Anesthesia: Moderate Sedation

## 2023-08-22 MED ORDER — MAGNESIUM SULFATE 4 GM/100ML IV SOLN
4.0000 g | Freq: Once | INTRAVENOUS | Status: AC
  Administered 2023-08-22: 4 g via INTRAVENOUS
  Filled 2023-08-22: qty 100

## 2023-08-22 MED ORDER — VERAPAMIL HCL 2.5 MG/ML IV SOLN
INTRAVENOUS | Status: DC | PRN
  Administered 2023-08-22: 1 mL via INTRA_ARTERIAL

## 2023-08-22 MED ORDER — POTASSIUM CHLORIDE CRYS ER 20 MEQ PO TBCR: 40.0000 meq | EXTENDED_RELEASE_TABLET | Freq: Two times a day (BID) | ORAL | Status: DC

## 2023-08-22 MED ORDER — SODIUM CHLORIDE 0.9% FLUSH
3.0000 mL | Freq: Two times a day (BID) | INTRAVENOUS | Status: DC
  Administered 2023-08-22: 3 mL via INTRAVENOUS

## 2023-08-22 MED ORDER — HEPARIN (PORCINE) IN NACL 1000-0.9 UT/500ML-% IV SOLN
INTRAVENOUS | Status: AC
  Filled 2023-08-22: qty 1000

## 2023-08-22 MED ORDER — MAGNESIUM SULFATE 50 % IJ SOLN
4.0000 g | Freq: Once | INTRAMUSCULAR | Status: DC
  Filled 2023-08-22: qty 8

## 2023-08-22 MED ORDER — HEPARIN (PORCINE) IN NACL 2000-0.9 UNIT/L-% IV SOLN
INTRAVENOUS | Status: DC | PRN
  Administered 2023-08-22: 1000 mL

## 2023-08-22 MED ORDER — LIDOCAINE HCL (PF) 1 % IJ SOLN
INTRAMUSCULAR | Status: DC | PRN
  Administered 2023-08-22 (×2): 2 mL

## 2023-08-22 MED ORDER — SODIUM CHLORIDE 0.9% FLUSH: 3.0000 mL | INTRAVENOUS | Status: DC | PRN

## 2023-08-22 MED ORDER — IOHEXOL 300 MG/ML  SOLN
INTRAMUSCULAR | Status: DC | PRN
  Administered 2023-08-22: 50 mL

## 2023-08-22 MED ORDER — HYDRALAZINE HCL 20 MG/ML IJ SOLN: 10.0000 mg | INTRAMUSCULAR | Status: AC | PRN

## 2023-08-22 MED ORDER — LABETALOL HCL 5 MG/ML IV SOLN: 10.0000 mg | INTRAVENOUS | Status: AC | PRN

## 2023-08-22 MED ORDER — SODIUM CHLORIDE 0.9 % IV SOLN: 250.0000 mL | INTRAVENOUS | Status: DC | PRN

## 2023-08-22 MED ORDER — FENTANYL CITRATE (PF) 100 MCG/2ML IJ SOLN
INTRAMUSCULAR | Status: AC
  Filled 2023-08-22: qty 2

## 2023-08-22 MED ORDER — FENTANYL CITRATE (PF) 100 MCG/2ML IJ SOLN
INTRAMUSCULAR | Status: DC | PRN
  Administered 2023-08-22: 25 ug via INTRAVENOUS

## 2023-08-22 MED ORDER — MIDAZOLAM HCL 2 MG/2ML IJ SOLN
INTRAMUSCULAR | Status: AC
  Filled 2023-08-22: qty 2

## 2023-08-22 MED ORDER — POTASSIUM CHLORIDE CRYS ER 20 MEQ PO TBCR
40.0000 meq | EXTENDED_RELEASE_TABLET | Freq: Two times a day (BID) | ORAL | Status: AC
  Administered 2023-08-22 (×2): 40 meq via ORAL
  Filled 2023-08-22 (×2): qty 2

## 2023-08-22 MED ORDER — MAGNESIUM SULFATE 4 GM/100ML IV SOLN
4.0000 g | Freq: Once | INTRAVENOUS | Status: DC
  Filled 2023-08-22: qty 100

## 2023-08-22 MED ORDER — HEPARIN SODIUM (PORCINE) 1000 UNIT/ML IJ SOLN
INTRAMUSCULAR | Status: DC | PRN
  Administered 2023-08-22: 3000 [IU] via INTRAVENOUS

## 2023-08-22 MED ORDER — MIDAZOLAM HCL 2 MG/2ML IJ SOLN
INTRAMUSCULAR | Status: DC | PRN
  Administered 2023-08-22: 1 mg via INTRAVENOUS

## 2023-08-22 MED ORDER — VERAPAMIL HCL 2.5 MG/ML IV SOLN
INTRAVENOUS | Status: AC
  Filled 2023-08-22: qty 2

## 2023-08-22 MED ORDER — HEPARIN SODIUM (PORCINE) 1000 UNIT/ML IJ SOLN
INTRAMUSCULAR | Status: AC
  Filled 2023-08-22: qty 10

## 2023-08-22 MED ORDER — LIDOCAINE HCL 1 % IJ SOLN
INTRAMUSCULAR | Status: AC
  Filled 2023-08-22: qty 20

## 2023-08-22 SURGICAL SUPPLY — 11 items
CATH BALLN WEDGE 5F 110CM (CATHETERS) IMPLANT
CATH INFINITI AMBI 5FR TG (CATHETERS) IMPLANT
DEVICE RAD TR BAND REGULAR (VASCULAR PRODUCTS) IMPLANT
DRAPE BRACHIAL (DRAPES) IMPLANT
GLIDESHEATH SLEND SS 6F .021 (SHEATH) IMPLANT
GUIDEWIRE INQWIRE 1.5J.035X260 (WIRE) IMPLANT
PACK CARDIAC CATH (CUSTOM PROCEDURE TRAY) ×1 IMPLANT
SET ATX-X65L (MISCELLANEOUS) IMPLANT
SHEATH GLIDE SLENDER 4/5FR (SHEATH) IMPLANT
STATION PROTECTION PRESSURIZED (MISCELLANEOUS) IMPLANT
WIRE HITORQ VERSACORE ST 145CM (WIRE) IMPLANT

## 2023-08-22 NOTE — Progress Notes (Signed)
 Rounding Note    Patient Name: Martha Welch Date of Encounter: 08/22/2023  [facility] Cardiologist: Sammy Crisp, MD   Subjective   Reports improved shortness of breath Abdomen less tight, lower extremity edema resolving Still having trouble with food, stomach upset -2.3 L today Reports weight down to 132 pounds, feels her baseline is between 125 and 128 pounds  Inpatient Medications    Scheduled Meds:  enoxaparin  (LOVENOX ) injection  40 mg Subcutaneous Q24H   furosemide  40 mg Intravenous BID   potassium chloride   40 mEq Oral BID   sodium chloride  flush  3 mL Intravenous Q12H   spironolactone  25 mg Oral Daily   Continuous Infusions:  sodium chloride  10 mL/hr at 08/22/23 0614   PRN Meds: acetaminophen , ondansetron  (ZOFRAN ) IV, sodium chloride  flush   Vital Signs    Vitals:   08/22/23 0319 08/22/23 0514 08/22/23 0737 08/22/23 1156  BP: 102/76  99/60 107/64  Pulse: 88  73 73  Resp: 18     Temp: (!) 97.4 F (36.3 C)  97.8 F (36.6 C) (!) 97.4 F (36.3 C)  TempSrc: Oral     SpO2: 90%  92% (!) 88%  Weight:  59.9 kg    Height:        Intake/Output Summary (Last 24 hours) at 08/22/2023 1344 Last data filed at 08/21/2023 2000 Gross per 24 hour  Intake 236 ml  Output 300 ml  Net -64 ml      08/22/2023    5:14 AM 08/21/2023    5:35 AM 08/20/2023    7:38 PM  Last 3 Weights  Weight (lbs) 132 lb 137 lb 4.8 oz 140 lb 9.6 oz  Weight (kg) 59.875 kg 62.279 kg 63.776 kg      Telemetry    Normal sinus rhyth- Personally Reviewed  ECG     - Personally Reviewed  Physical Exam   GEN: No acute distress.   Neck: No JVD Cardiac: RRR, no murmurs, rubs, or gallops.  Respiratory: Clear to auscultation bilaterally. GI: Soft, nontender, non-distended  MS: No edema; No deformity. Neuro:  Nonfocal  Psych: Normal affect   Labs    High Sensitivity Troponin:  No results for input(s): "TROPONINIHS" in the last 720 hours.   Chemistry Recent Labs  Lab  08/20/23 0933 08/21/23 0313 08/22/23 0436  NA 142 142 141  K 3.2* 3.5 3.4*  CL 108 106 102  CO2 25 28 28   GLUCOSE 137* 101* 95  BUN 10 10 10   CREATININE 0.95 0.84 0.77  CALCIUM 8.1* 8.1* 8.4*  MG 1.6* 1.6* 1.6*  PROT 5.6*  --   --   ALBUMIN 3.1*  --   --   AST 22  --   --   ALT 19  --   --   ALKPHOS 92  --   --   BILITOT 0.8  --   --   GFRNONAA >60 >60 >60  ANIONGAP 9 8 11     Lipids  Recent Labs  Lab 08/21/23 0313  CHOL 97  TRIG 68  HDL 34*  LDLCALC 49  CHOLHDL 2.9    Hematology Recent Labs  Lab 08/20/23 0933 08/22/23 0436  WBC 9.6 10.1  RBC 5.16* 5.06  HGB 14.9 14.2  HCT 46.9* 45.0  MCV 90.9 88.9  MCH 28.9 28.1  MCHC 31.8 31.6  RDW 14.3 14.1  PLT 156 128*   Thyroid  Recent Labs  Lab 08/20/23 0933  TSH 3.773  BNP Recent Labs  Lab 08/20/23 0933  BNP 1,570.7*    DDimer No results for input(s): "DDIMER" in the last 168 hours.   Radiology    ECHOCARDIOGRAM COMPLETE Result Date: 08/21/2023    ECHOCARDIOGRAM REPORT   Patient Name:   KRISTYNE KINDSCHI [facility] Date of Exam: 08/20/2023 Medical Rec #:  [id number]       Height:       63.0 in Accession #:    [id number]      Weight:       140.6 lb Date of Birth:  1965-11-03       BSA:          1.665 m Patient Age:    57 years        BP:           139/75 mmHg Patient Gender: F               HR:           56 bpm. Exam Location:  [facility] Procedure: 2D Echo, Cardiac Doppler and Color Doppler (Both Spectral and Color            Flow Doppler were utilized during procedure). Indications:     I50.9 Congestive heart failure  History:         Patient has no prior history of Echocardiogram examinations.  Sonographer:     Brigid Canada RDCS Referring Phys:  [id number] Diagnosing Phys: Sammy Crisp MD IMPRESSIONS  1. Left ventricular ejection fraction, by estimation, is 25 to 30%. The left ventricle has severely decreased function. The left ventricle demonstrates global hypokinesis. The left ventricular internal cavity  size was severely dilated. There is mild left ventricular hypertrophy. Left ventricular diastolic parameters are consistent with Grade II diastolic dysfunction (pseudonormalization). Elevated left atrial pressure.  2. Right ventricular systolic function is mildly reduced. The right ventricular size is normal. There is mildly elevated pulmonary artery systolic pressure.  3. Left atrial size was moderately dilated.  4. A small pericardial effusion is present. The pericardial effusion is circumferential.  5. The mitral valve is degenerative. Moderate mitral valve regurgitation. No evidence of mitral stenosis.  6. Tricuspid valve regurgitation is moderate.  7. The aortic valve is tricuspid. Aortic valve regurgitation is not visualized. No aortic stenosis is present.  8. The inferior vena cava is normal in size with <50% respiratory variability, suggesting right atrial pressure of 8 mmHg. FINDINGS  Left Ventricle: Left ventricular ejection fraction, by estimation, is 25 to 30%. The left ventricle has severely decreased function. The left ventricle demonstrates global hypokinesis. The left ventricular internal cavity size was severely dilated. There is mild left ventricular hypertrophy. Left ventricular diastolic parameters are consistent with Grade II diastolic dysfunction (pseudonormalization). Elevated left atrial pressure. Right Ventricle: The right ventricular size is normal. No increase in right ventricular wall thickness. Right ventricular systolic function is mildly reduced. There is mildly elevated pulmonary artery systolic pressure. The tricuspid regurgitant velocity  is 3.02 m/s, and with an assumed right atrial pressure of 8 mmHg, the estimated right ventricular systolic pressure is 44.5 mmHg. Left Atrium: Left atrial size was moderately dilated. Right Atrium: Right atrial size was normal in size. Pericardium: A small pericardial effusion is present. The pericardial effusion is circumferential. Mitral Valve:  The mitral valve is degenerative in appearance. There is mild thickening of the mitral valve leaflet(s). Mild mitral annular calcification. Moderate mitral valve regurgitation. No evidence of mitral valve stenosis. Tricuspid Valve: The tricuspid valve is  normal in structure. Tricuspid valve regurgitation is moderate. Aortic Valve: The aortic valve is tricuspid. Aortic valve regurgitation is not visualized. No aortic stenosis is present. Aortic valve mean gradient measures 3.7 mmHg. Aortic valve peak gradient measures 6.2 mmHg. Aortic valve area, by VTI measures 1.56 cm. Pulmonic Valve: The pulmonic valve was normal in structure. Pulmonic valve regurgitation is not visualized. No evidence of pulmonic stenosis. Aorta: The aortic root and ascending aorta are structurally normal, with no evidence of dilitation. Pulmonary Artery: The pulmonary artery is of normal size. Venous: The inferior vena cava is normal in size with less than 50% respiratory variability, suggesting right atrial pressure of 8 mmHg. IAS/Shunts: No atrial level shunt detected by color flow Doppler. Additional Comments: There is a small pleural effusion in the right lateral region.  LEFT VENTRICLE PLAX 2D LVIDd:         6.20 cm   Diastology LVIDs:         5.40 cm   LV e' medial:    2.92 cm/s LV PW:         1.10 cm   LV E/e' medial:  35.3 LV IVS:        0.90 cm   LV e' lateral:   6.41 cm/s LVOT diam:     2.10 cm   LV E/e' lateral: 16.1 LV SV:         33 LV SV Index:   20 LVOT Area:     3.46 cm  RIGHT VENTRICLE            IVC RV Basal diam:  3.90 cm    IVC diam: 1.80 cm RV S prime:     8.92 cm/s TAPSE (M-mode): 1.6 cm LEFT ATRIUM             Index        RIGHT ATRIUM           Index LA diam:        5.20 cm 3.12 cm/m   RA Area:     15.20 cm LA Vol (A2C):   75.1 ml 45.11 ml/m  RA Volume:   40.40 ml  24.27 ml/m LA Vol (A4C):   80.2 ml 48.18 ml/m LA Biplane Vol: 80.5 ml 48.36 ml/m  AORTIC VALVE AV Area (Vmax):    1.57 cm AV Area (Vmean):   1.52 cm  AV Area (VTI):     1.56 cm AV Vmax:           124.67 cm/s AV Vmean:          92.663 cm/s AV VTI:            0.212 m AV Peak Grad:      6.2 mmHg AV Mean Grad:      3.7 mmHg LVOT Vmax:         56.60 cm/s LVOT Vmean:        40.533 cm/s LVOT VTI:          0.095 m LVOT/AV VTI ratio: 0.45  AORTA Ao Root diam: 3.10 cm Ao Asc diam:  3.00 cm MITRAL VALVE                TRICUSPID VALVE MV Area (PHT): 4.96 cm     TR Peak grad:   36.5 mmHg MV Decel Time: 153 msec     TR Vmax:        302.00 cm/s MV E velocity: 103.00 cm/s MV A velocity: 81.85 cm/s   SHUNTS  MV E/A ratio:  1.26         Systemic VTI:  0.10 m                             Systemic Diam: 2.10 cm Sammy Crisp MD Electronically signed by Sammy Crisp MD Signature Date/Time: 08/21/2023/6:27:50 AM    Final     Cardiac Studies   Echo . Left ventricular ejection fraction, by estimation, is 25 to 30%. The  left ventricle has severely decreased function. The left ventricle  demonstrates global hypokinesis. The left ventricular internal cavity size  was severely dilated. There is mild  left ventricular hypertrophy. Left ventricular diastolic parameters are  consistent with Grade II diastolic dysfunction (pseudonormalization).  Elevated left atrial pressure.   2. Right ventricular systolic function is mildly reduced. The right  ventricular size is normal. There is mildly elevated pulmonary artery  systolic pressure.   3. Left atrial size was moderately dilated.   4. A small pericardial effusion is present. The pericardial effusion is  circumferential.   5. The mitral valve is degenerative. Moderate mitral valve regurgitation.  No evidence of mitral stenosis.   6. Tricuspid valve regurgitation is moderate.   7. The aortic valve is tricuspid. Aortic valve regurgitation is not  visualized. No aortic stenosis is present.   8. The inferior vena cava is normal in size with <50% respiratory  variability, suggesting right atrial pressure of 8 mmHg.    Patient Profile     58 y.o. female with a history of UTI, nephrolithiasis, pyelonephritis and hypokalemia who presented 5/6 for progressive LE swelling with newly diagnosed HFrEF.   Assessment & Plan    Dilated cardiomyopathy Ejection fraction 25 to 30% on echocardiogram with global hypokinesis, unable to exclude ischemic versus nonischemic cardiomyopathy (alcohol/PVCs) - Symptoms starting November 2024 in the setting of upper respiratory infection, worsening abdominal distention, fluid retention over the past several weeks - Risk factors include smoking, alcohol - Symptoms improving with IV Lasix twice daily and spironolactone -Not on SGLT2 inhibitor secondary to recurrent UTIs -Will hold off on Entresto until after cardiac catheterization -Add beta-blocker once euvolemic  PVCs Noted on EKG, telemetry - Beta-blocker once euvolemic  - Consider Holter monitor to determine PVC burden   Smoker Declined a nicotine  patch Reports husband smokes in the house   Alcohol Drinks fireball at night to go to sleep Cessation recommended  For questions or updates, please contact [facility] Please consult [email or web address] for contact info under        Signed, Billyjack Trompeter, MD  08/22/2023, 1:44 PM

## 2023-08-22 NOTE — Progress Notes (Signed)
 Heart Failure Nurse Navigator Progress Note  PCP: Pcp, No PCP-Cardiologist: Sammy Crisp, MD Admission Diagnosis: Anasarca Admitted from: Home via POV  Presentation:   ALICEMARIE WOULARD presented with several months of pain in her right hip that worsens after work. Swelling to bilateral legs, abdomen and eyelids. Per patient swelling for the past 6 months or so but worsening now. She denied shortness of breath and no chest pain.  Per ED note she does not currently take any prescription medications. Denies any history of congestive heart failure.  Chest x-ray: Basilar infiltrates and atelectasis.  ECHO/ LVEF: EF 25-30% Left ventricle has severely decreased function and global hypokinesis. Left Ventricular internal cavity size was severely dilated.  Left ventricular hypertrophy.  Left ventricular diastolic parameters are consistent with Grade II diastolic dysfunction (pseudonormalization). Left atrial size moderately dilated.  Moderate MV & Tricuspid regurgitation.   08/22/2023 Heart Cath-results Pending  Clinical Course:  Past Medical History:  Diagnosis Date   Anemia      Social History   Socioeconomic History   Marital status: Married    Spouse name: Not on file   Number of children: Not on file   Years of education: Not on file   Highest education level: Not on file  Occupational History   Not on file  Tobacco Use   Smoking status: Every Day    Current packs/day: 1.50    Types: Cigarettes   Smokeless tobacco: Never  Vaping Use   Vaping status: Never Used  Substance and Sexual Activity   Alcohol use: Yes    Comment: socially    Drug use: No   Sexual activity: Yes  Other Topics Concern   Not on file  Social History Narrative   Not on file   Social Drivers of Health   Financial Resource Strain: Not on file  Food Insecurity: No Food Insecurity (08/20/2023)   Hunger Vital Sign    Worried About Running Out of Food in the Last Year: Never true    Ran Out of Food in the  Last Year: Never true  Transportation Needs: No Transportation Needs (08/20/2023)   PRAPARE - Administrator, Civil Service (Medical): No    Lack of Transportation (Non-Medical): No  Physical Activity: Not on file  Stress: Not on file  Social Connections: Not on file   Education Assessment and Provision:  Detailed education and instructions provided on heart failure disease management including the following:  Signs and symptoms of Heart Failure When to call the physician Importance of daily weights Low sodium diet Fluid restriction Medication management Anticipated future follow-up appointments  Patient education given on each of the above topics.  Patient acknowledges understanding via teach back method and acceptance of all instructions.  Education Materials:  "Living Better With Heart Failure" Booklet, HF zone tool, & Daily Weight Tracker Tool.  Patient has scale at home: No-has the ability to obtain one from her dad Patient has pill box at home: No-has the ability to purchase one  High Risk Criteria for Readmission and/or Poor Patient Outcomes: Heart failure hospital admissions (last 6 months): 0  No Show rate: 11% Difficult social situation: Smoking & ETOH use Demonstrates medication adherence: Per patient has not had to take medications before this event. Primary Language: English Literacy level: Reading, Writing & Comprehension  Barriers of Care:   Diet & Fluid Restrictions-patient currently high sodium diet and Mountain Dew drinker Smoking cessation & ETOH use Daily Weights-will be getting a scale  Considerations/Referrals:  Referral made to Heart Failure Pharmacist Stewardship: Yes Referral made to Heart Failure CSW/NCM TOC: No Referral made to Heart & Vascular TOC clinic: Yes. TOC 08/29/23 @ 9:15 AM  Items for Follow-up on DC/TOC: Daily weights Diet & Fluid Restrictions Smoking Cessation ETOH-drinks Fireball at bedtime to sleep  New HF  Medication Education Continued Heart Failure Education  Celedonio Coil, RN, BSN [facility] Heart Failure Navigator Secure Chat Only

## 2023-08-22 NOTE — Progress Notes (Addendum)
 Heart Failure Stewardship Pharmacy Note  PCP: Pcp, No PCP-Cardiologist: Sammy Crisp, MD  HPI: Martha Welch is a 58 y.o. female with history of nephrolithiasis, UTIs, HFrEF, and chronic hypokalemia who presented with progressively worsening edema. On admission, BNP was 1570.7 and lactic acid 1.0. Chest x-ray showed small bilateral pleural effusions and atelectasis. ECHO 08/20/2023 showed LVEF 25-30% with severely decreased left ventricle function, severely dilated left ventricular internal cavity size, mild RV systolic function reduction, and moderate mitral valve regurgitation. Cardiology planning heart catheterization 08/22/23.  Pertinent Lab Values: Creatinine, Ser  Date Value Ref Range Status  08/22/2023 0.77 0.44 - 1.00 mg/dL Final   BUN  Date Value Ref Range Status  08/22/2023 10 6 - 20 mg/dL Final   Potassium  Date Value Ref Range Status  08/22/2023 3.4 (L) 3.5 - 5.1 mmol/L Final   Sodium  Date Value Ref Range Status  08/22/2023 141 135 - 145 mmol/L Final   B Natriuretic Peptide  Date Value Ref Range Status  08/20/2023 1,570.7 (H) 0.0 - 100.0 pg/mL Final    Comment:    Performed at [facility], [address] [place], [place] [address]   Magnesium  Date Value Ref Range Status  08/22/2023 1.6 (L) 1.7 - 2.4 mg/dL Final    Comment:    Performed at [facility], [address] [place], [place] [address]   Hgb A1c MFr Bld  Date Value Ref Range Status  08/20/2023 5.6 4.8 - 5.6 % Final    Comment:    (NOTE) Pre diabetes:          5.7%-6.4%  Diabetes:              >6.4%  Glycemic control for   <7.0% adults with diabetes    TSH  Date Value Ref Range Status  08/20/2023 3.773 0.350 - 4.500 uIU/mL Final    Comment:    Performed by a 3rd Generation assay with a functional sensitivity of <=0.01 uIU/mL. Performed at [facility], [address] [place], [place] [address]     Vital Signs: Admission weight:147 lbs Temp:  [97.4  F (36.3 C)-98.1 F (36.7 C)] 97.4 F (36.3 C) (05/08 0319) Pulse Rate:  [68-88] 88 (05/08 0319) Cardiac Rhythm: Normal sinus rhythm (05/07 1918) Resp:  [16-18] 18 (05/08 0319) BP: (87-111)/(51-76) 102/76 (05/08 0319) SpO2:  [89 %-94 %] 90 % (05/08 0319) Weight:  [59.9 kg (132 lb)] 59.9 kg (132 lb) (05/08 0514)  Intake/Output Summary (Last 24 hours) at 08/22/2023 0721 Last data filed at 08/21/2023 2000 Gross per 24 hour  Intake 236 ml  Output 300 ml  Net -64 ml   Current Heart Failure Medications:  Loop diuretic: furosemide 40 mg IV BID Beta-Blocker: none ACEI/ARB/ARNI: none MRA: spironolactone 25 mg daily  SGLT2i: none Other: none   Prior to admission Heart Failure Medications:  Loop diuretic: none Beta-Blocker: none ACEI/ARB/ARNI: none MRA: none SGLT2i: none Other: none  Assessment: 1. Acute on chronic systolic heart failure (LVEF 25-30%) with mildly reduced RV function, due to unknown etiology. NYHA class II-III symptoms.  -Symptoms: Patient reports LE edema and SOB has improved. Denies dyspnea on exertion or orthopnea.  -Volume: Patient appears euvolemic today. Creatinine and BUN stable. Currently on furosemide 40 mg IV BID. Can adjust pending volume status seen on cath today. -Hemodynamics: BP normal. HR 70-80s.  -BB: Can consider adding metoprolol succinate 12.5 mg daily after cath if euvolemic.  -ACEI/ARB/ARNI: Can consider adding losartan 12.5 mg daily after  cath. -MRA: Continue spironolactone 25 mg daily.  -SGLT2i: Not a candidate given recurrent UTI history.   Plan: 1) Medication changes recommended at this time: - None, can reassess after cath today.  2) Patient assistance: - Patient is uninsured. Will require patient assistance if initiated on branded medications. Can use med management with [facility] pharmacy.  3) Education: -To be completed prior to discharge. - Patient has been educated on current HF medications and potential additions to HF medication  regimen - Patient verbalizes understanding that over the next few months, these medication doses may change and more medications may be added to optimize HF regimen - Patient has been educated on basic disease state pathophysiology and goals of therapy   Medication Assistance / Insurance Benefits Check: Does the patient have prescription insurance?  No  Type of insurance plan:  Does the patient qualify for medication assistance through manufacturers or grants? Pending  Outpatient Pharmacy: Prior to admission outpatient pharmacy: [facility]     Please do not hesitate to reach out with questions or concerns,  Bevely Brush, PharmD, CPP, BCPS Heart Failure Pharmacist  Phone - [phone] 08/22/2023 10:30 AM

## 2023-08-22 NOTE — Progress Notes (Signed)
 Dr. Addie Holstein in at bedside, speaking with pt. And her spouse re: cath results. Both pt. And spouse verbalized understanding of conversation with MD.

## 2023-08-22 NOTE — Plan of Care (Signed)
  Problem: Education: Goal: Knowledge of General Education information will improve Description: Including pain rating scale, medication(s)/side effects and non-pharmacologic comfort measures Outcome: Progressing   Problem: Clinical Measurements: Goal: Will remain free from infection Outcome: Progressing   Problem: Clinical Measurements: Goal: Respiratory complications will improve Outcome: Progressing   Problem: Clinical Measurements: Goal: Cardiovascular complication will be avoided Outcome: Progressing   Problem: Activity: Goal: Risk for activity intolerance will decrease Outcome: Progressing   Problem: Pain Managment: Goal: General experience of comfort will improve and/or be controlled Outcome: Progressing

## 2023-08-22 NOTE — TOC Initial Note (Signed)
 Transition of Care [facility]) - Initial/Assessment Note    Patient Details  Name: Martha Welch MRN: [id number] Date of Birth: 1965/05/10  [facility] of [facility]) CM/SW Contact:    Odilia Bennett, LCSW Phone Number: 08/22/2023, 10:20 AM  Clinical Narrative:   CSW met with patient. No family at bedside. CSW introduced role and inquired about not having a PCP or insurance which she confirmed. Pharmacy is aware. CSW provided packet for free/low-cost healthcare in [facility] and intake paperwork for the [employer]. No further concerns. CSW will continue to follow patient for support and facilitate return home once stable. Patient will drive herself home at discharge.               Expected Discharge Plan: Home/Self Care Barriers to Discharge: Continued Medical Work up   Patient Goals and CMS Choice            Expected Discharge Plan and Services     Post Acute Care Choice: NA Living arrangements for the past 2 months: Single Family Home                                      Prior Living Arrangements/Services Living arrangements for the past 2 months: Single Family Home   Patient language and need for interpreter reviewed:: Yes Do you feel safe going back to the place where you live?: Yes      Need for Family Participation in Patient Care: Yes (Comment)     Criminal Activity/Legal Involvement Pertinent to Current Situation/Hospitalization: No - Comment as needed  Activities of Daily Living   ADL Screening (condition at time of admission) Independently performs ADLs?: Yes (appropriate for developmental age) Is the patient deaf or have difficulty hearing?: No Does the patient have difficulty seeing, even when wearing glasses/contacts?: No Does the patient have difficulty concentrating, remembering, or making decisions?: No  Permission Sought/Granted                  Emotional Assessment Appearance:: Appears stated age Attitude/Demeanor/Rapport:  Engaged, Gracious Affect (typically observed): Accepting, Appropriate, Calm, Pleasant Orientation: : Oriented to Self, Oriented to Place, Oriented to  Time, Oriented to Situation Alcohol / Substance Use: Not Applicable Psych Involvement: No (comment)  Admission diagnosis:  Acute heart failure (HCC) [I50.9] Anasarca [R60.1] Patient Active Problem List   Diagnosis Date Noted   Dilated cardiomyopathy (HCC) 08/21/2023   Acute heart failure (HCC) 08/20/2023   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   Tobacco abuse 08/20/2023   Urinary tract infection due to extended-spectrum beta lactamase (ESBL) producing Escherichia coli 02/14/2022   Nicotine  dependence 02/13/2022   Hydronephrosis with obstructing calculus 02/13/2022   Acute pyelonephritis 02/08/2022   Kidney stone 02/08/2022   AKI (acute kidney injury) (HCC) 02/08/2022   Hypokalemia 02/08/2022   Prediabetes 02/08/2022   PCP:  Vicente Graham No Pharmacy:   [facility] [address] [place] - [address] [place] [address] Phone: [phone] Fax: [phone]  [facility] [address] [place] [address] Phone: [phone] Fax: [phone]     Social Drivers of Health (SDOH) Social History: SDOH Screenings   Food Insecurity: No Food Insecurity (08/20/2023)  Housing: Low Risk  (08/20/2023)  Transportation Needs: No Transportation Needs (08/20/2023)  Utilities: Not At Risk (08/20/2023)  Tobacco Use: High Risk (08/20/2023)   SDOH Interventions:     Readmission Risk  Interventions     No data to display

## 2023-08-22 NOTE — Progress Notes (Signed)
 Progress Note   Patient: Martha Welch ZHY:[id number] DOB: [birth year] DOA: 08/20/2023     2 DOS: the patient was seen and examined on 08/22/2023    Brief hospital course: From HPI "Martha Welch is a 58 y.o. female with medical history significant of recurrent nephrolithiasis, who presents to the ED due to swelling.   Martha Welch states that for the last 5 months, she has been experiencing gradually worsening lower extremity swelling.  During this time, she is also experienced increased hip and shin pain.  Then in the last 1 to 2 weeks, she has noticed swelling of her abdomen to the point that she was unable to buckle her pants earlier today.  She is also experiencing facial swelling especially around the eyes.  She denies any orthopnea, however her husband at bedside states that she coughs very heavily at night when she lays flat.     Martha Welch states that it seems all her symptoms began after she had a severe cold back in November 2024 and she never recovered afterwards.   ED course: On arrival to the ED, patient was normotensive at 121/67 with heart rate of 88.  She was saturating at 93% on room air.  She was afebrile 97.9.  Initial workup notable for unremarkable CBC, potassium 3.2, creatinine 0.95, albumin 3.1 and GFR above 60.  BNP 1570.  Chest x-ray with bilateral infiltrates.  Right upper quadrant ultrasound with right pleural effusion but no abnormality of the liver or bile ducts.  Patient started on Lasix, potassium, and cardiology was consulted.  TRH contacted for admission.  "   Assessment and Plan: Acute systolic and diastolic congestive heart failure (HCC)  Continue telemetry Echo showing EF 25 to 30% with grade 2 diastolic dysfunction Plan of care discussed with cardiology Continue IV Lasix Monitor input and output as well as daily weight Continue spironolactone Not on SGLT2 inhibitors secondary to recurrent UTI Cardiology plans to add beta-blocker once  euvolemic Holding Entresto until after cardiac catheterization today   Frequent PVCs Continue to call right electrolytes Monitor on telemetry closely   Hypokalemia Hypomagnesemia Continue repletion and monitoring   Prediabetes Last A1c of 5.8% approximately 1 year ago. A1c of 5.6   Advance Care Planning:   Code Status: Full Code    Consults: Cardiology   Subjective:  Patient seen and examined at bedside this morning Denies nausea vomiting abdominal pain chest pain   Physical Exam:   Appearance: She is normal weight.  HENT:     Head: Normocephalic and atraumatic.  Eyes:     Conjunctiva/sclera: Conjunctivae normal.     Pupils: Pupils are equal, round, and reactive to light.  Cardiovascular:     Rate and Rhythm: Normal rate and regular rhythm.     Heart sounds: No murmur heard. Pulmonary: Air entry decreased at the bases with mild crackles Abdominal:     General: Abdominal distention improving    Tenderness: There is no abdominal tenderness. There is no guarding.  Musculoskeletal: Bilateral lower extremity pitting edema Neurological:     General: No focal deficit present.     Mental Status: She is alert and oriented to person, place, and time.  Psychiatric:        Mood and Affect: Mood normal.        Behavior: Behavior normal.      Family Communication: Discussed with patient's husband present at bedside   Disposition: Hopefully home when medically stable Status is: Inpatient  Time spent: 45 minutes   Data Reviewed: I have reviewed abdominal ultrasound that did not show any significant abnormality Chest x-ray showed bibasilar atelectasis    Vitals:   08/22/23 0514 08/22/23 0737 08/22/23 1156 08/22/23 1353  BP:  99/60 107/64 (!) 108/57  Pulse:  73 73 83  Resp:    17  Temp:  97.8 F (36.6 C) (!) 97.4 F (36.3 C) 97.6 F (36.4 C)  TempSrc:    Oral  SpO2:  92% (!) 88% 92%  Weight: 59.9 kg   59.9 kg  Height:    5\' 3"  (1.6 m)      Latest Ref Rng &  Units 08/22/2023    4:36 AM 08/20/2023    9:33 AM 02/16/2022    9:40 AM  CBC  WBC 4.0 - 10.5 K/uL 10.1  9.6  13.9   Hemoglobin 12.0 - 15.0 g/dL 82.9  56.2  13.0   Hematocrit 36.0 - 46.0 % 45.0  46.9  39.3   Platelets 150 - 400 K/uL 128  156  319        Latest Ref Rng & Units 08/22/2023    4:36 AM 08/21/2023    3:13 AM 08/20/2023    9:33 AM  BMP  Glucose 70 - 99 mg/dL 95  865  784   BUN 6 - 20 mg/dL 10  10  10    Creatinine 0.44 - 1.00 mg/dL 6.96  2.95  2.84   Sodium 135 - 145 mmol/L 141  142  142   Potassium 3.5 - 5.1 mmol/L 3.4  3.5  3.2   Chloride 98 - 111 mmol/L 102  106  108   CO2 22 - 32 mmol/L 28  28  25    Calcium 8.9 - 10.3 mg/dL 8.4  8.1  8.1      Author: Ezzard Holms, MD 08/22/2023 3:59 PM  For on call review [email or web address].

## 2023-08-22 NOTE — Progress Notes (Signed)
 PHARMACY CONSULT NOTE - FOLLOW UP  Pharmacy Consult for Electrolyte Monitoring and Replacement   Recent Labs: Potassium (mmol/L)  Date Value  08/22/2023 3.4 (L)   Magnesium (mg/dL)  Date Value  69/62/9528 1.6 (L)   Calcium (mg/dL)  Date Value  41/32/4401 8.4 (L)   Albumin (g/dL)  Date Value  02/72/5366 3.1 (L)   Sodium (mmol/L)  Date Value  08/22/2023 141     Assessment: Patient is a 58yo female presenting with increased swelling with hx of nephrolithiasis UTIs.  Pharmacy consulted for electrolyte management.  mIVF: sodium chloride  0.9% infusion 24mL/hr  Pertinent medications: furosemide 40mg  IV 2 times daily, spironolactone 25mg  daily  Goal of Therapy:  Maintain electrolytes within range  Plan: --K 3.4 >> potassium chloride  40mEq PO Q12h x 2  Want to aim for 4.0 while on diuretic, gave 40mEq x 1 yesterday and K 3.5 -> 3.4 --Mag 1.6 >> magnesium sulfate 4g IV x 1 Since mag sulfate 2g IV was given yesterday did not elevate mag level --Follow up on AM labs  Ara Knee, PharmD Candidate 08/22/2023 8:34 AM

## 2023-08-23 ENCOUNTER — Encounter: Payer: Self-pay | Admitting: Cardiology

## 2023-08-23 ENCOUNTER — Other Ambulatory Visit: Payer: Self-pay

## 2023-08-23 DIAGNOSIS — I5021 Acute systolic (congestive) heart failure: Secondary | ICD-10-CM

## 2023-08-23 LAB — CBC WITH DIFFERENTIAL/PLATELET
Abs Immature Granulocytes: 0.04 10*3/uL (ref 0.00–0.07)
Basophils Absolute: 0.1 10*3/uL (ref 0.0–0.1)
Basophils Relative: 0 %
Eosinophils Absolute: 0.1 10*3/uL (ref 0.0–0.5)
Eosinophils Relative: 1 %
HCT: 48.2 % — ABNORMAL HIGH (ref 36.0–46.0)
Hemoglobin: 15.5 g/dL — ABNORMAL HIGH (ref 12.0–15.0)
Immature Granulocytes: 0 %
Lymphocytes Relative: 24 %
Lymphs Abs: 2.8 10*3/uL (ref 0.7–4.0)
MCH: 28.3 pg (ref 26.0–34.0)
MCHC: 32.2 g/dL (ref 30.0–36.0)
MCV: 88.1 fL (ref 80.0–100.0)
Monocytes Absolute: 0.6 10*3/uL (ref 0.1–1.0)
Monocytes Relative: 5 %
Neutro Abs: 7.9 10*3/uL — ABNORMAL HIGH (ref 1.7–7.7)
Neutrophils Relative %: 70 %
Platelets: 156 10*3/uL (ref 150–400)
RBC: 5.47 MIL/uL — ABNORMAL HIGH (ref 3.87–5.11)
RDW: 14 % (ref 11.5–15.5)
WBC: 11.6 10*3/uL — ABNORMAL HIGH (ref 4.0–10.5)
nRBC: 0 % (ref 0.0–0.2)

## 2023-08-23 LAB — BASIC METABOLIC PANEL WITH GFR
Anion gap: 8 (ref 5–15)
BUN: 13 mg/dL (ref 6–20)
CO2: 25 mmol/L (ref 22–32)
Calcium: 8.7 mg/dL — ABNORMAL LOW (ref 8.9–10.3)
Chloride: 103 mmol/L (ref 98–111)
Creatinine, Ser: 0.92 mg/dL (ref 0.44–1.00)
GFR, Estimated: >60 mL/min (ref >60)
Glucose, Bld: 121 mg/dL — ABNORMAL HIGH (ref 70–99)
Potassium: 5 mmol/L (ref 3.5–5.1)
Sodium: 136 mmol/L (ref 135–145)

## 2023-08-23 LAB — MAGNESIUM: Magnesium: 2.5 mg/dL — ABNORMAL HIGH (ref 1.7–2.4)

## 2023-08-23 MED ORDER — METOPROLOL SUCCINATE ER 25 MG PO TB24
12.5000 mg | ORAL_TABLET | Freq: Every day | ORAL | Status: DC
  Administered 2023-08-23: 12.5 mg via ORAL
  Filled 2023-08-23: qty 1

## 2023-08-23 MED ORDER — METOPROLOL SUCCINATE ER 25 MG PO TB24
12.5000 mg | ORAL_TABLET | Freq: Every day | ORAL | 2 refills | Status: DC
  Filled 2023-08-23: qty 30, 60d supply, fill #0

## 2023-08-23 MED ORDER — SPIRONOLACTONE 25 MG PO TABS
25.0000 mg | ORAL_TABLET | Freq: Every day | ORAL | 1 refills | Status: DC
Start: 2023-08-24 — End: 2023-09-18
  Filled 2023-08-23: qty 30, 30d supply, fill #0

## 2023-08-23 MED ORDER — FUROSEMIDE 20 MG PO TABS
20.0000 mg | ORAL_TABLET | Freq: Every day | ORAL | 2 refills | Status: DC
  Filled 2023-08-23: qty 30, 30d supply, fill #0

## 2023-08-23 MED ORDER — FUROSEMIDE 20 MG PO TABS
20.0000 mg | ORAL_TABLET | Freq: Every day | ORAL | Status: DC
  Administered 2023-08-23: 20 mg via ORAL
  Filled 2023-08-23: qty 1

## 2023-08-23 MED ORDER — SPIRONOLACTONE 25 MG PO TABS: 25.0000 mg | ORAL_TABLET | Freq: Every day | ORAL | Status: DC

## 2023-08-23 NOTE — Progress Notes (Addendum)
 Heart Failure Stewardship Pharmacy Note  PCP: Pcp, No PCP-Cardiologist: Sammy Crisp, MD  HPI: Martha Welch is a 58 y.o. female with history of nephrolithiasis, UTIs, HFrEF, and chronic hypokalemia who presented with progressively worsening edema. On admission, BNP was 1570.7 and lactic acid 1.0. Chest x-ray showed small bilateral pleural effusions and atelectasis. ECHO 08/20/2023 showed LVEF 25-30% with severely decreased left ventricle function, severely dilated left ventricular internal cavity size, mild RV systolic function reduction, and moderate mitral valve regurgitation. Cardiology performed heart catheterization 08/22/23, which showed normal filling pressure and no obstructive CAD.  Pertinent Lab Values: Creatinine, Ser  Date Value Ref Range Status  08/22/2023 0.77 0.44 - 1.00 mg/dL Final   BUN  Date Value Ref Range Status  08/22/2023 10 6 - 20 mg/dL Final   Potassium  Date Value Ref Range Status  08/22/2023 4.3 3.5 - 5.1 mmol/L Final   Sodium  Date Value Ref Range Status  08/22/2023 141 135 - 145 mmol/L Final   B Natriuretic Peptide  Date Value Ref Range Status  08/20/2023 1,570.7 (H) 0.0 - 100.0 pg/mL Final    Comment:    Performed at [facility], [address] [place], [place] [address]   Magnesium   Date Value Ref Range Status  08/22/2023 1.6 (L) 1.7 - 2.4 mg/dL Final    Comment:    Performed at [facility], [address] [place], [place] [address]   Hgb A1c MFr Bld  Date Value Ref Range Status  08/20/2023 5.6 4.8 - 5.6 % Final    Comment:    (NOTE) Pre diabetes:          5.7%-6.4%  Diabetes:              >6.4%  Glycemic control for   <7.0% adults with diabetes    TSH  Date Value Ref Range Status  08/20/2023 3.773 0.350 - 4.500 uIU/mL Final    Comment:    Performed by a 3rd Generation assay with a functional sensitivity of <=0.01 uIU/mL. Performed at [facility], [address] [place], [place] [address]      Vital Signs: Admission weight:147 lbs Temp:  [97.4 F (36.3 C)-98.6 F (37 C)] 98.6 F (37 C) (05/09 0601) Pulse Rate:  [73-94] 94 (05/09 0601) Cardiac Rhythm: Sinus tachycardia (05/08 2015) Resp:  [14-24] 21 (05/09 0601) BP: (88-121)/(54-82) 113/70 (05/09 0601) SpO2:  [83 %-97 %] 96 % (05/09 0601) Weight:  [58.3 kg (128 lb 9.6 oz)-59.9 kg (132 lb)] 58.3 kg (128 lb 9.6 oz) (05/09 0311)  Intake/Output Summary (Last 24 hours) at 08/23/2023 2841 Last data filed at 08/22/2023 2206 Gross per 24 hour  Intake 952 ml  Output --  Net 952 ml   Current Heart Failure Medications:  Loop diuretic: furosemide  20 mg daily Beta-Blocker: metoprolol 12.5 mg daily  ACEI/ARB/ARNI: none MRA: spironolactone  25 mg daily  SGLT2i: none Other: none   Prior to admission Heart Failure Medications:  Loop diuretic: none Beta-Blocker: none ACEI/ARB/ARNI: none MRA: none SGLT2i: none Other: none  Assessment: 1. Acute on chronic systolic heart failure (LVEF 25-30%) with mildly reduced RV function, due to unknown etiology. NYHA class II symptoms.  -Symptoms: Patient denies symptoms today. -Volume: Patient appears euvolemic today, confirmed on cath yesterday. Creatinine and BUN stable. May only require furosemide  20 mg prn on discharge.  -Hemodynamics: BP normal. HR 70-80s.  -BB: Continue metoprolol 12.5 mg daily.  -ACEI/ARB/ARNI: Would not start at this time given most recent potassium level. Can  consider adding losartan 12.5 mg once potassium decreases.  -MRA: Consider reducing spironolactone  to 12.5 mg daily given most recent potassium level.  -SGLT2i: Not a candidate given recurrent UTI history.   Plan: 1) Medication changes recommended at this time: - Consider holding spironolactone , then reducing to 12.5 mg daily if potassium is trending down  2) Patient assistance: - Patient is uninsured. Will require patient assistance if initiated on branded medications. Can use med management with [facility]  pharmacy.  3) Education: -To be completed prior to discharge. - Patient has been educated on current HF medications and potential additions to HF medication regimen - Patient verbalizes understanding that over the next few months, these medication doses may change and more medications may be added to optimize HF regimen - Patient has been educated on basic disease state pathophysiology and goals of therapy   Medication Assistance / Insurance Benefits Check: Does the patient have prescription insurance?  No  Type of insurance plan:  Does the patient qualify for medication assistance through manufacturers or grants? Pending  Outpatient Pharmacy: Prior to admission outpatient pharmacy: [facility]    Please do not hesitate to reach out with questions or concerns,  Bevely Brush, PharmD, CPP, BCPS Heart Failure Pharmacist  Phone - [phone] 08/23/2023 10:05 AM

## 2023-08-23 NOTE — Discharge Summary (Signed)
 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 female with medical history significant of recurrent nephrolithiasis, who presents to the ED due to swelling.   Martha Welch states that for the last 5 months, she has been experiencing gradually worsening lower extremity swelling.  During this time, she is also experienced increased hip and shin pain.  Then in the last 1 to 2 weeks, she has noticed swelling of her abdomen to the point that she was unable to buckle her pants earlier today.  She is also experiencing facial swelling especially around the eyes.  She denies any orthopnea, however her husband at bedside states that she coughs very heavily at night when she lays flat.  Patient was subsequently admitted with suspicion of CHF exacerbation.  Echo showed EF 25 to 30% and subsequently patient was seen by cardiologist.  Patient underwent cardiac catheterization that detected normal coronaries.  Currently patient is euvolemic and has been cleared by cardiologist for discharge today and will follow-up as an outpatient.  Consultants: Cardiology Procedures performed: As mentioned above Disposition: Home Diet recommendation:  Cardiac diet DISCHARGE MEDICATION: Allergies as of 08/23/2023       Reactions   Amoxicillin  Other (See Comments)   Yeast infection        Medication List     STOP taking these medications    oxybutynin  5 MG tablet Commonly known as: DITROPAN        TAKE these medications    furosemide  20 MG tablet Commonly known as: LASIX  Take 1 tablet (20 mg total) by mouth daily. Start taking on:  Aug 24, 2023   metoprolol succinate 25 MG 24 hr tablet Commonly known as: TOPROL-XL Take 0.5 tablets (12.5 mg total) by mouth daily. Start taking on: Aug 24, 2023   spironolactone  25 MG tablet Commonly known as: ALDACTONE  Take 1 tablet (25 mg total) by mouth daily. Start taking on: Aug 24, 2023        Follow-up Information     [facility]. Go on 08/29/2023.   Specialty: Cardiology Why: Hospital Follow-Up 08/29/23 @ 9:15 AM Please bring all medications to follow up appointment [facility], [address] Free Valet Parking at the [employer] information: [address] [place]  [address] [phone]               Discharge Exam: Martha Welch Weights   08/22/23 0514 08/22/23 1353 08/23/23 0311  Weight: 59.9 kg 59.9 kg 58.3 kg   Appearance: She is normal weight.  HENT:     Head: Normocephalic and atraumatic.  Eyes:     Conjunctiva/sclera: Conjunctivae normal.     Pupils: Pupils are equal, round, and reactive to light.  Cardiovascular:     Rate and Rhythm: Normal rate and regular rhythm.     Heart sounds: No murmur heard. Pulmonary: Air entry decreased at the bases with mild crackles Abdominal:     General: Abdominal distention improving    Tenderness: There is no abdominal tenderness. There is no guarding.  Musculoskeletal: Bilateral lower extremity pitting edema Neurological:     General:  No focal deficit present.     Mental Status: She is alert and oriented to person, place, and time.  Psychiatric:        Mood and Affect: Mood normal.        Behavior: Behavior normal.   Condition at discharge: good  The results of significant diagnostics from this hospitalization (including imaging, microbiology, ancillary and laboratory) are listed below for reference.   Imaging Studies: CARDIAC CATHETERIZATION Result Date: 08/22/2023 Images from the original result were not  included.   LV end diastolic pressure is normal.   There is no aortic valve stenosis.   Anticipated discharge date to be determined.   Patient is adequately diuresed, will stop IV Lasix .  Consider p.o. Lasix  tomorrow morning   No indication for antiplatelet therapy at this time . Dominance: Left Angiographically normal coronary arteries with a Left Dominant System Essentially Normal Right Heart Cath Pressures with Severely Reduced Cardiac Output and Index: - PAP-mean 26/13-20 mmHg; PCWP 11 mmHg. LVEDP 10 mmHg. - Ao sat 90%, PA sat 61% Cardiac Output by Fick 3.62 with an Index of 2.23 RECOMMENDATIONS   Anticipated discharge date to be determined.   Patient is adequately diuresed, will stop IV Lasix .  Consider p.o. Lasix  tomorrow morning   No indication for antiplatelet therapy at this time . Arleen Lacer, MD, MS Randene Bustard, M.D., M.S. Interventional Cardiologist  [facility] Pager # [phone]  ECHOCARDIOGRAM COMPLETE Result Date: 08/21/2023    ECHOCARDIOGRAM REPORT   Patient Name:   Martha Welch [facility] Date of Exam: 08/20/2023 Medical Rec #:  [id number]       Height:       63.0 in Accession #:    [id number]      Weight:       140.6 lb Date of Birth:  04/27/1965       BSA:          1.665 m Patient Age:    57 years        BP:           139/75 mmHg Patient Gender: F               HR:           56 bpm. Exam Location:  [facility] Procedure: 2D Echo, Cardiac Doppler and Color Doppler (Both Spectral and Color            Flow Doppler were utilized during procedure). Indications:     I50.9 Congestive heart failure  History:         Patient has no prior history of Echocardiogram examinations.  Sonographer:     Brigid Canada RDCS Referring Phys:  [id number] CHRISTOPHER END Diagnosing Phys: Sammy Crisp MD IMPRESSIONS  1. Left ventricular ejection fraction, by estimation, is 25 to 30%. The left ventricle has severely decreased function. The left ventricle demonstrates global hypokinesis. The left ventricular  internal cavity size was severely dilated. There is mild left ventricular hypertrophy. Left ventricular diastolic parameters are consistent with Grade II diastolic dysfunction (pseudonormalization). Elevated left atrial pressure.  2. Right ventricular systolic function is mildly reduced. The right ventricular size is normal. There is mildly elevated pulmonary artery systolic pressure.  3. Left atrial size was moderately dilated.  4. A small pericardial effusion is present. The pericardial effusion is circumferential.  5. The mitral valve is degenerative. Moderate mitral valve regurgitation. No evidence of mitral stenosis.  6. Tricuspid valve regurgitation is moderate.  7. The aortic valve is tricuspid. Aortic  valve regurgitation is not visualized. No aortic stenosis is present.  8. The inferior vena cava is normal in size with <50% respiratory variability, suggesting right atrial pressure of 8 mmHg. FINDINGS  Left Ventricle: Left ventricular ejection fraction, by estimation, is 25 to 30%. The left ventricle has severely decreased function. The left ventricle demonstrates global hypokinesis. The left ventricular internal cavity size was severely dilated. There is mild left ventricular hypertrophy. Left ventricular diastolic parameters are consistent with Grade II diastolic dysfunction (pseudonormalization). Elevated left atrial pressure. Right Ventricle: The right ventricular size is normal. No increase in right ventricular wall thickness. Right ventricular systolic function is mildly reduced. There is mildly elevated pulmonary artery systolic pressure. The tricuspid regurgitant velocity  is 3.02 m/s, and with an assumed right atrial pressure of 8 mmHg, the estimated right ventricular systolic pressure is 44.5 mmHg. Left Atrium: Left atrial size was moderately dilated. Right Atrium: Right atrial size was normal in size. Pericardium: A small pericardial effusion is present. The pericardial effusion is circumferential.  Mitral Valve: The mitral valve is degenerative in appearance. There is mild thickening of the mitral valve leaflet(s). Mild mitral annular calcification. Moderate mitral valve regurgitation. No evidence of mitral valve stenosis. Tricuspid Valve: The tricuspid valve is normal in structure. Tricuspid valve regurgitation is moderate. Aortic Valve: The aortic valve is tricuspid. Aortic valve regurgitation is not visualized. No aortic stenosis is present. Aortic valve mean gradient measures 3.7 mmHg. Aortic valve peak gradient measures 6.2 mmHg. Aortic valve area, by VTI measures 1.56 cm. Pulmonic Valve: The pulmonic valve was normal in structure. Pulmonic valve regurgitation is not visualized. No evidence of pulmonic stenosis. Aorta: The aortic root and ascending aorta are structurally normal, with no evidence of dilitation. Pulmonary Artery: The pulmonary artery is of normal size. Venous: The inferior vena cava is normal in size with less than 50% respiratory variability, suggesting right atrial pressure of 8 mmHg. IAS/Shunts: No atrial level shunt detected by color flow Doppler. Additional Comments: There is a small pleural effusion in the right lateral region.  LEFT VENTRICLE PLAX 2D LVIDd:         6.20 cm   Diastology LVIDs:         5.40 cm   LV e' medial:    2.92 cm/s LV PW:         1.10 cm   LV E/e' medial:  35.3 LV IVS:        0.90 cm   LV e' lateral:   6.41 cm/s LVOT diam:     2.10 cm   LV E/e' lateral: 16.1 LV SV:         33 LV SV Index:   20 LVOT Area:     3.46 cm  RIGHT VENTRICLE            IVC RV Basal diam:  3.90 cm    IVC diam: 1.80 cm RV S prime:     8.92 cm/s TAPSE (M-mode): 1.6 cm LEFT ATRIUM             Index        RIGHT ATRIUM           Index LA diam:        5.20 cm 3.12 cm/m   RA Area:     15.20 cm LA Vol (A2C):   75.1 ml 45.11 ml/m  RA Volume:   40.40 ml  24.27 ml/m LA Vol (A4C):   80.2 ml 48.18 ml/m LA Biplane Vol: 80.5  ml 48.36 ml/m  AORTIC VALVE AV Area (Vmax):    1.57 cm AV Area  (Vmean):   1.52 cm AV Area (VTI):     1.56 cm AV Vmax:           124.67 cm/s AV Vmean:          92.663 cm/s AV VTI:            0.212 m AV Peak Grad:      6.2 mmHg AV Mean Grad:      3.7 mmHg LVOT Vmax:         56.60 cm/s LVOT Vmean:        40.533 cm/s LVOT VTI:          0.095 m LVOT/AV VTI ratio: 0.45  AORTA Ao Root diam: 3.10 cm Ao Asc diam:  3.00 cm MITRAL VALVE                TRICUSPID VALVE MV Area (PHT): 4.96 cm     TR Peak grad:   36.5 mmHg MV Decel Time: 153 msec     TR Vmax:        302.00 cm/s MV E velocity: 103.00 cm/s MV A velocity: 81.85 cm/s   SHUNTS MV E/A ratio:  1.26         Systemic VTI:  0.10 m                             Systemic Diam: 2.10 cm Sammy Crisp MD Electronically signed by Sammy Crisp MD Signature Date/Time: 08/21/2023/6:27:50 AM    Final    US  ABDOMEN LIMITED RUQ (LIVER/GB) Result Date: 08/20/2023 CLINICAL DATA:  [id number] Abdominal pain [id number]. EXAM: ULTRASOUND ABDOMEN LIMITED RIGHT UPPER QUADRANT COMPARISON:  None Available. FINDINGS: Gallbladder: Surgically absent. Common bile duct: Diameter: 4-7 mm.  No intrahepatic bile duct dilation. Liver: No focal lesion identified. Within normal limits in parenchymal echogenicity. Portal vein is patent on color Doppler imaging with normal direction of blood flow towards the liver. Other: Incidentally noted is right pleural effusion. IMPRESSION: *Surgically absent gallbladder. No significant sonographic abnormality of the liver or bile ducts. *Right pleural effusion. Electronically Signed   By: Beula Brunswick M.D.   On: 08/20/2023 11:45   DG HIP UNILAT WITH PELVIS 2-3 VIEWS RIGHT Result Date: 08/20/2023 CLINICAL DATA:  Right hip pain EXAM: DG HIP (WITH OR WITHOUT PELVIS) 2-3V RIGHT COMPARISON:  None Available. FINDINGS: There is no evidence of hip fracture or dislocation. There is no evidence of arthropathy or other focal bone abnormality. IMPRESSION: Negative. Electronically Signed   By: Fredrich Jefferson M.D.   On: 08/20/2023 11:00   DG  Chest 2 View Result Date: 08/20/2023 CLINICAL DATA:  Shortness of breath EXAM: CHEST - 2 VIEW COMPARISON:  December 05, 2018 FINDINGS: Bilateral basilar infiltrates and atelectasis with a small bilateral pleural effusions or reactions without consolidations. Heart and mediastinum normal IMPRESSION: Basilar infiltrates and atelectasis Electronically Signed   By: Fredrich Jefferson M.D.   On: 08/20/2023 10:16    Microbiology: Results for orders placed or performed in visit on 02/21/22  Microscopic Examination     Status: Abnormal   Collection Time: 02/21/22  8:03 AM   Urine  Result Value Ref Range Status   WBC, UA 11-30 (A) 0 - 5 /hpf Final   RBC, Urine >30 (A) 0 - 2 /hpf Final   Epithelial Cells (non renal) 0-10 0 - 10 /hpf Final  Bacteria, UA Few None seen/Few Final    Labs: CBC: Recent Labs  Lab 08/20/23 0933 08/22/23 0436 08/22/23 1642 08/22/23 1649 08/22/23 1650 08/23/23 0728  WBC 9.6 10.1  --   --   --  11.6*  NEUTROABS  --  6.7  --   --   --  7.9*  HGB 14.9 14.2 15.0 15.3* 15.3* 15.5*  HCT 46.9* 45.0 44.0 45.0 45.0 48.2*  MCV 90.9 88.9  --   --   --  88.1  PLT 156 128*  --   --   --  156   Basic Metabolic Panel: Recent Labs  Lab 08/20/23 0933 08/21/23 0313 08/22/23 0436 08/22/23 1642 08/22/23 1649 08/22/23 1650 08/23/23 0728  NA 142 142 141 141 141 141 136  K 3.2* 3.5 3.4* 4.2 4.3 4.3 5.0  CL 108 106 102  --   --   --  103  CO2 25 28 28   --   --   --  25  GLUCOSE 137* 101* 95  --   --   --  121*  BUN 10 10 10   --   --   --  13  CREATININE 0.95 0.84 0.77  --   --   --  0.92  CALCIUM 8.1* 8.1* 8.4*  --   --   --  8.7*  MG 1.6* 1.6* 1.6*  --   --   --  2.5*   Liver Function Tests: Recent Labs  Lab 08/20/23 0933  AST 22  ALT 19  ALKPHOS 92  BILITOT 0.8  PROT 5.6*  ALBUMIN 3.1*   CBG: No results for input(s): "GLUCAP" in the last 168 hours.  Discharge time spent:  37 minutes.  Signed: Ezzard Holms, MD [facility] 08/23/2023

## 2023-08-23 NOTE — Progress Notes (Signed)
 PHARMACY CONSULT NOTE - FOLLOW UP  Pharmacy Consult for Electrolyte Monitoring and Replacement   Recent Labs: Potassium (mmol/L)  Date Value  08/23/2023 5.0   Magnesium  (mg/dL)  Date Value  16/01/9603 2.5 (H)   Calcium (mg/dL)  Date Value  54/12/8117 8.7 (L)   Albumin (g/dL)  Date Value  14/78/2956 3.1 (L)   Sodium (mmol/L)  Date Value  08/23/2023 136     Assessment: Patient is a 58yo female presenting with increased swelling with hx of nephrolithiasis UTIs. New onset of HFrEF., s/p cath, normal coronaries, and PCWP 11. Pharmacy consulted for electrolyte management.  Pertinent medications: Holding furosemide  4, spironolactone  25mg  daily  Goal of Therapy:  Maintain electrolytes within range  Plan: No electrolytes replacement needed. Holding spironolactone  5/9 and will let cardiology team assess.  F/u with AM labs.   Harlie Lieu, PharmD 08/23/2023 8:38 AM

## 2023-08-23 NOTE — Progress Notes (Deleted)
 PHARMACY CONSULT NOTE - FOLLOW UP  Pharmacy Consult for Electrolyte Monitoring and Replacement   Recent Labs: Potassium (mmol/L)  Date Value  08/22/2023 4.3   Magnesium  (mg/dL)  Date Value  86/57/8469 1.6 (L)   Calcium (mg/dL)  Date Value  62/95/2841 8.4 (L)   Albumin (g/dL)  Date Value  32/44/0102 3.1 (L)   Sodium (mmol/L)  Date Value  08/22/2023 141     Assessment: Patient is a 58yo female presenting with increased swelling with hx of nephrolithiasis UTIs.  Pharmacy consulted for electrolyte management.  mIVF: sodium chloride  0.9% infusion 32mL/hr  Pertinent medications: furosemide  40mg  IV 2 times daily, spironolactone  25mg  daily  Goal of Therapy:  Maintain electrolytes within range  Plan: --K 3.4 >> potassium chloride  40mEq PO Q12h x 2  Want to aim for 4.0 while on diuretic, gave 40mEq x 1 yesterday and K 3.5 -> 3.4 --Mag 1.6 >> magnesium  sulfate 4g IV x 1 Since mag sulfate 2g IV was given yesterday did not elevate mag level --Follow up on AM labs  Ara Knee, PharmD Candidate 08/23/2023 7:57 AM

## 2023-08-23 NOTE — Progress Notes (Signed)
 Cardiology Progress Note   Patient Name: Martha Welch Date of Encounter: 08/23/2023  Primary Cardiologist: Sammy Crisp, MD  Subjective   Feels well this AM.  Breathing at baseline. No chest pain.  R wrist ok.  Feels back to baseline. Objective   Inpatient Medications    Scheduled Meds:  enoxaparin  (LOVENOX ) injection  40 mg Subcutaneous Q24H   furosemide   20 mg Oral Daily   metoprolol succinate  12.5 mg Oral Daily   sodium chloride  flush  3 mL Intravenous Q12H   sodium chloride  flush  3 mL Intravenous Q12H   [START ON 08/24/2023] spironolactone   25 mg Oral Daily   Continuous Infusions:  sodium chloride      PRN Meds: sodium chloride , acetaminophen , ondansetron  (ZOFRAN ) IV, sodium chloride  flush, sodium chloride  flush   Vital Signs    Vitals:   08/22/23 2305 08/23/23 0311 08/23/23 0601 08/23/23 0740  BP: 105/63 102/62 113/70 114/78  Pulse: 89 75 94 80  Resp: (!) 22 19 (!) 21 16  Temp: 98.2 F (36.8 C) 98.4 F (36.9 C) 98.6 F (37 C) 98 F (36.7 C)  TempSrc: Oral Oral Oral Oral  SpO2: 97% 96% 96% 96%  Weight:  58.3 kg    Height:        Intake/Output Summary (Last 24 hours) at 08/23/2023 0941 Last data filed at 08/22/2023 2206 Gross per 24 hour  Intake 952 ml  Output --  Net 952 ml   Filed Weights   08/22/23 0514 08/22/23 1353 08/23/23 0311  Weight: 59.9 kg 59.9 kg 58.3 kg    Physical Exam   GEN: Well nourished, well developed, in no acute distress.  HEENT: Grossly normal.  Neck: Supple, no JVD, carotid bruits, or masses. Cardiac: RRR, no murmurs, rubs, or gallops. No clubbing, cyanosis, edema.  Radials 2+, DP/PT 2+ and equal bilaterally. R ulnar cath site w/o bleeding/bruit/hematoma. Respiratory:  Respirations regular and unlabored, bibasilar crackles. GI: Soft, nontender, nondistended, BS + x 4. MS: no deformity or atrophy. Skin: warm and dry, no rash. Neuro:  Strength and sensation are intact. Psych: AAOx3.  Normal affect.  Labs     Chemistry Recent Labs  Lab 08/20/23 0933 08/21/23 0313 08/22/23 0436 08/22/23 1642 08/22/23 1649 08/22/23 1650 08/23/23 0728  NA 142 142 141   < > 141 141 136  K 3.2* 3.5 3.4*   < > 4.3 4.3 5.0  CL 108 106 102  --   --   --  103  CO2 25 28 28   --   --   --  25  GLUCOSE 137* 101* 95  --   --   --  121*  BUN 10 10 10   --   --   --  13  CREATININE 0.95 0.84 0.77  --   --   --  0.92  CALCIUM 8.1* 8.1* 8.4*  --   --   --  8.7*  PROT 5.6*  --   --   --   --   --   --   ALBUMIN 3.1*  --   --   --   --   --   --   AST 22  --   --   --   --   --   --   ALT 19  --   --   --   --   --   --   ALKPHOS 92  --   --   --   --   --   --  BILITOT 0.8  --   --   --   --   --   --   GFRNONAA >60 >60 >60  --   --   --  >60  ANIONGAP 9 8 11   --   --   --  8   < > = values in this interval not displayed.     Hematology Recent Labs  Lab 08/20/23 0933 08/22/23 0436 08/22/23 1642 08/22/23 1649 08/22/23 1650 08/23/23 0728  WBC 9.6 10.1  --   --   --  11.6*  RBC 5.16* 5.06  --   --   --  5.47*  HGB 14.9 14.2   < > 15.3* 15.3* 15.5*  HCT 46.9* 45.0   < > 45.0 45.0 48.2*  MCV 90.9 88.9  --   --   --  88.1  MCH 28.9 28.1  --   --   --  28.3  MCHC 31.8 31.6  --   --   --  32.2  RDW 14.3 14.1  --   --   --  14.0  PLT 156 128*  --   --   --  156   < > = values in this interval not displayed.    BNP    Component Value Date/Time   BNP 1,570.7 (H) 08/20/2023 0933    Lipids  Lab Results  Component Value Date   CHOL 97 08/21/2023   HDL 34 (L) 08/21/2023   LDLCALC 49 08/21/2023   TRIG 68 08/21/2023   CHOLHDL 2.9 08/21/2023    HbA1c  Lab Results  Component Value Date   HGBA1C 5.6 08/20/2023    Radiology    US  ABDOMEN LIMITED RUQ (LIVER/GB) Result Date: 08/20/2023 CLINICAL DATA:  [id number] Abdominal pain 644753. EXAM: ULTRASOUND ABDOMEN LIMITED RIGHT UPPER QUADRANT COMPARISON:  None Available. FINDINGS: Gallbladder: Surgically absent. Common bile duct: Diameter: 4-7 mm.  No  intrahepatic bile duct dilation. Liver: No focal lesion identified. Within normal limits in parenchymal echogenicity. Portal vein is patent on color Doppler imaging with normal direction of blood flow towards the liver. Other: Incidentally noted is right pleural effusion. IMPRESSION: *Surgically absent gallbladder. No significant sonographic abnormality of the liver or bile ducts. *Right pleural effusion. Electronically Signed   By: Beula Brunswick M.D.   On: 08/20/2023 11:45   DG HIP UNILAT WITH PELVIS 2-3 VIEWS RIGHT Result Date: 08/20/2023 CLINICAL DATA:  Right hip pain EXAM: DG HIP (WITH OR WITHOUT PELVIS) 2-3V RIGHT COMPARISON:  None Available. FINDINGS: There is no evidence of hip fracture or dislocation. There is no evidence of arthropathy or other focal bone abnormality. IMPRESSION: Negative. Electronically Signed   By: Fredrich Jefferson M.D.   On: 08/20/2023 11:00   DG Chest 2 View Result Date: 08/20/2023 CLINICAL DATA:  Shortness of breath EXAM: CHEST - 2 VIEW COMPARISON:  December 05, 2018 FINDINGS: Bilateral basilar infiltrates and atelectasis with a small bilateral pleural effusions or reactions without consolidations. Heart and mediastinum normal IMPRESSION: Basilar infiltrates and atelectasis Electronically Signed   By: Fredrich Jefferson M.D.   On: 08/20/2023 10:16     Telemetry    RSR - Personally Reviewed  Cardiac Studies   2D Echocardiogram 5.6.2025  1. Left ventricular ejection fraction, by estimation, is 25 to 30%. The  left ventricle has severely decreased function. The left ventricle  demonstrates global hypokinesis. The left ventricular internal cavity size  was severely dilated. There is mild  left ventricular hypertrophy. Left ventricular diastolic parameters  are  consistent with Grade II diastolic dysfunction (pseudonormalization).  Elevated left atrial pressure.   2. Right ventricular systolic function is mildly reduced. The right  ventricular size is normal. There is mildly  elevated pulmonary artery  systolic pressure.   3. Left atrial size was moderately dilated.   4. A small pericardial effusion is present. The pericardial effusion is  circumferential.   5. The mitral valve is degenerative. Moderate mitral valve regurgitation.  No evidence of mitral stenosis.   6. Tricuspid valve regurgitation is moderate.   7. The aortic valve is tricuspid. Aortic valve regurgitation is not  visualized. No aortic stenosis is present.   8. The inferior vena cava is normal in size with <50% respiratory  variability, suggesting right atrial pressure of 8 mmHg.  _____________   Cardiac Catheterization  5.8.2025  Diagnostic Dominance: Left  Right     RA Mean  mmHg 2    RA A-Wave  mmHg 5    RA V-Wave  mmHg 4  Pulmonary     PA  mmHg 26/13 (20)    PCW Mean  mmHg 10.0    PCW A-Wave  mmHg 13.0    PCW V-Wave  mmHg 11.0    PAPi   6.5    Saturations Phases Resting    PA  % 61    Arterial  % 90  Right Heart Pressures Normal pulmonary pressures  LVEDP is normal. AO sat 90%, PA sat 61% Cardiac Output/Index Albertina Hugger) 3.62/2.23  Patient Profile     58 y.o. female with a history of UTI, nephrolithiasis, pyelonephritis and hypokalemia who presented 5/6 for progressive LE swelling with newly diagnosed HFrEF.   Assessment & Plan    1.  NICM/Acute HFrEF:  Admitted w/ progressive HF.  EF 25-30% by echo.  Cath 5/8 w/ nl cors, nl filling pressures, reduced CO/CI.  Feels well this AM. Euvolemic.  Soft bp has limited GDMT up to this point.  Only on spiro 25.  Will add low dose toprol xl 12.5 daily and lasix  20 po.  No BP room for acei/arb/arni at this time.  F/u next wk in HF clinic to re-eval.  No sglt2i due to h/o frequent UTI's.  Also no insurance.  2.  PVCs:  relatively quiescent overnight.  Adding low-dose ? blocker.  3.  Tob Abuse:  smoking 1/2 ppd.  Not interested in chantix.  Says she will try to quit, though husband also smokes.  May try patches.  Complete cessation  advised.  4.  ETOH abuse:  likely contributing to NICM.  Cessation advised.  5.  Hypokalemia:  resolved w/ supplementation yesterday.  K now 5.0.  Will need f/u at office f/u in setting of spiro.  Signed, Laneta Pintos, NP  08/23/2023, 9:41 AM    For questions or updates, please contact   Please consult [email or web address] for contact info under Cardiology/STEMI.

## 2023-08-24 LAB — LIPOPROTEIN A (LPA): Lipoprotein (a): 97.5 nmol/L — ABNORMAL HIGH (ref <75.0)

## 2023-08-28 ENCOUNTER — Telehealth: Payer: Self-pay | Admitting: Cardiology

## 2023-08-28 NOTE — Telephone Encounter (Signed)
 Called to confirm/remind patient of their appointment at [facility] on 08/29/23.   Appointment:   [] Confirmed  [x] Left mess   [] No answer/No voice mail  [] VM Full/unable to leave message  [] Phone not in service  Patient reminded to bring all medications and/or complete list.  Confirmed patient has transportation. Gave directions, instructed to utilize valet parking.

## 2023-08-29 ENCOUNTER — Ambulatory Visit: Payer: MEDICAID | Attending: Cardiology | Admitting: Cardiology

## 2023-08-29 VITALS — BP 122/74 | HR 66 | Wt 121.0 lb

## 2023-08-29 DIAGNOSIS — I420 Dilated cardiomyopathy: Secondary | ICD-10-CM | POA: Insufficient documentation

## 2023-08-29 DIAGNOSIS — I110 Hypertensive heart disease with heart failure: Secondary | ICD-10-CM | POA: Insufficient documentation

## 2023-08-29 DIAGNOSIS — F17200 Nicotine dependence, unspecified, uncomplicated: Secondary | ICD-10-CM | POA: Insufficient documentation

## 2023-08-29 DIAGNOSIS — I5022 Chronic systolic (congestive) heart failure: Secondary | ICD-10-CM | POA: Insufficient documentation

## 2023-08-29 DIAGNOSIS — I493 Ventricular premature depolarization: Secondary | ICD-10-CM | POA: Insufficient documentation

## 2023-08-29 MED ORDER — FUROSEMIDE 20 MG PO TABS: 20.0000 mg | ORAL_TABLET | Freq: Every day | ORAL | 2 refills | Status: DC | PRN

## 2023-08-29 MED ORDER — METOPROLOL SUCCINATE ER 25 MG PO TB24: 25.0000 mg | ORAL_TABLET | Freq: Every day | ORAL | 3 refills | Status: DC

## 2023-08-29 MED ORDER — LOSARTAN POTASSIUM 25 MG PO TABS: 25.0000 mg | ORAL_TABLET | Freq: Every day | ORAL | 3 refills | Status: DC

## 2023-08-29 NOTE — Patient Instructions (Signed)
 Medication Changes:  START Losartan 25mg  (1 tab) daily  CONTINUE Metoprolol 25mg   CHANGE Furosemide  (1 tab) daily as needed for swelling.   Lab Work:  Go DOWN to LOWER LEVEL (LL) to have your blood work completed inside of [employer] office.  We will only call you if the results are abnormal or if the provider would like to make medication changes.  Follow-Up in: Please follow up with the [facility] in 2 weeks with the Pharmacist and again in 2 months with Dr. Alease Amend.  At the [facility], you and your health needs are our priority. We have a designated team specialized in the treatment of Heart Failure. This Care Team includes your primary Heart Failure Specialized Cardiologist (physician), Advanced Practice Providers (APPs- Physician Assistants and Nurse Practitioners), and Pharmacist who all work together to provide you with the care you need, when you need it.   You may see any of the following providers on your designated Care Team at your next follow up:  Dr. Jules Oar Dr. Peder Bourdon Dr. Alwin Baars Dr. Judyth Nunnery Shawnee Dellen, FNP Bevely Brush, RPH-CPP  Please be sure to bring in all your medications bottles to every appointment.   Need to Contact Us :  If you have any questions or concerns before your next appointment please send us  a message through [place] or call our office at [phone].    TO LEAVE A MESSAGE FOR THE NURSE SELECT OPTION 2, PLEASE LEAVE A MESSAGE INCLUDING: YOUR NAME DATE OF BIRTH CALL BACK NUMBER REASON FOR CALL**this is important as we prioritize the call backs  YOU WILL RECEIVE A CALL BACK THE SAME DAY AS LONG AS YOU CALL BEFORE 4:00 PM

## 2023-08-30 LAB — BASIC METABOLIC PANEL WITH GFR
BUN/Creatinine Ratio: 18 (ref 9–23)
BUN: 21 mg/dL (ref 6–24)
CO2: 20 mmol/L (ref 20–29)
Calcium: 9.4 mg/dL (ref 8.7–10.2)
Chloride: 101 mmol/L (ref 96–106)
Creatinine, Ser: 1.2 mg/dL — ABNORMAL HIGH (ref 0.57–1.00)
Glucose: 110 mg/dL — ABNORMAL HIGH (ref 70–99)
Potassium: 4.6 mmol/L (ref 3.5–5.2)
Sodium: 142 mmol/L (ref 134–144)
eGFR: 53 mL/min/{1.73_m2} — ABNORMAL LOW (ref >59)

## 2023-09-04 NOTE — Progress Notes (Signed)
   ADVANCED HEART FAILURE NEW PATIENT CLINIC NOTE  Referring Physician: No ref. provider found  Primary Care: Pcp, No Primary Cardiologist:  HPI: Martha Welch is a 58 y.o. female with a PMH of recently diagnosed HFrEF who presents for initial visit for further evaluation and treatment of chronic systolic heart failure     Patient recently admitted to the hospital for lower extremity swelling. She was admitted for a HF exacerbation, underwent extensive IV diuresis and had coronary angiography with normal coronaries. Symptoms started in November 2024 after a URI.     SUBJECTIVE: Since discharge she has had difficulty keeping food down as well as diarrhea. She does note that her breathing is much improved and she denies any chest pain, orthopnea, or PND. She has not had any lightheadedness, dizziness. BP looks good in clinic today. No prior history of HF.   PMH, current medications, allergies, social history, and family history reviewed in epic.  PHYSICAL EXAM: Vitals:   08/29/23 0911  BP: 122/74  Pulse: 66  SpO2: 96%   GENERAL: Well nourished and in no apparent distress at rest.  PULM:  Normal work of breathing, clear to auscultation bilaterally. Respirations are unlabored.  CARDIAC:  JVP: flat         Normal rate with regular rhythm. No murmurs, rubs or gallops.  Trace edema. Warm and well perfused extremities. ABDOMEN: Soft, non-tender, non-distended. NEUROLOGIC: Patient is oriented x3 with no focal or lateralizing neurologic deficits.    DATA REVIEW  ECG: 08/22/23: Sinus tachycardia with PVCs    ECHO: 08/22/23: LVEF 25-30%, RV function mildly reduced, small pericardial effusion  CATH: 08/22/23: Normal coronary arteries, normal PA pressures, normal filling pressures, Index of 2.2    Heart failure review: - Classification: Heart failure with reduced EF - Etiology: Work up ongoing - NYHA Class: II - Volume status: Euvolemic - ACEi/ARB/ARNI: Currently up-titrating -  Aldosterone antagonist: Maximally tolerated dose - Beta-blocker: Currently up-titrating - Digoxin: Not indicated - Hydralazine /Nitrates: Not indicated - SGLT2i: Maximally tolerated dose - GLP-1: Consider in future - Advanced therapies: Not needed at this time - ICD: Currently uptitrating GDMT   ASSESSMENT & PLAN:  Chronic systolic heart failure: Unclear etiology, she does have frequent PVCs but based on review of her strips does not appear to be high enough to cause her decompensation. Preceding viral illness so myocarditis a possibility. - Dry on exam, can stop scheduled lasix . - Start losartan  25mg  daily - Continue metoprolol  succinate 25mg  daily - No SGLT-2 with frequent UTI - Continue spironolactone  25mg  daily - Consider zio patch at next visit - Maximally titrate GDMT prior to repeat echo, follow up with pharmacy in 2 weeks  Tobacco use:  - encouraged cessation  Hypertension: Well controlled today. - Continue therapy as above  Follow up in 2 months  Arta Lark, MD Advanced Heart Failure Mechanical Circulatory Support 09/04/23

## 2023-09-11 ENCOUNTER — Telehealth: Payer: Self-pay | Admitting: Pharmacist

## 2023-09-11 NOTE — Progress Notes (Unsigned)
  Advanced [facility] Failure Clinic Note  PCP: Pcp, No PCP-Cardiologist: Sammy Crisp, MD HF-Cardiologist: Arta Lark, MD  HPI: Martha Welch is a 58 y.o. female with a PMH of recently diagnosed HFrEF.   Patient was admitted 08/22/2023. ECG from admission showed sinus tachycardia with PVCs. Echo from admission showed EF 25-30%, LV global hypokinesis, mild LVH, grade II diastolic dysfunction, mildly elevated PASP, small pericardial effusion, moderate MR, moderate TR.   Right/left heart cath from admission showed normal coronary arteries, normal PA pressures, normal filling pressures, CI 2.23. Last saw Dr. Alease Amend 08/29/2023. Patient reported difficultly keeping food down. Losartan  25 mg daily started. Furosemide  20 mg daily stopped due to patient being euvolemic.   Today Martha Welch returns to [facility] for pharmacist medication titration. Reports feeling ***. {Reports/Denies:210917258} {ACTIONS;DENIES/REPORTS:21021675::"Denies"} being able to complete all activities of daily living (ADLs). Is *** active throughout the day. Weight at home is *** pounds. Takes no diuretic. Appetite ***. {Does Follow/Does Not Follow:210917261} a low sodium diet.  Current Heart Failure Medications: Loop diuretic: none Beta-Blocker: metoprolol  succinate 25 mg daily  ACEI/ARB/ARNI: losartan  25 mg daily  MRA: spironolactone  25 mg daily  SGLT2i: none Other: none   Has the patient been experiencing any side effects to the medications prescribed? {yes/no:20286}  Does the patient have any problems obtaining medications due to transportation or finances? {yes/no:20286}  Understanding of regimen: {CHL AMB AHFC Excellent/Good/Fair/Poor:210917262}  Understanding of indications: {CHL AMB AHFC Excellent/Good/Fair/Poor:210917262}  Potential of adherence: {CHL AMB AHFC Excellent/Good/Fair/Poor:210917262}  Patient understands to avoid NSAIDs.  Patient understands to avoid decongestants.  Pertinent  Lab Values: Creatinine, Ser  Date Value Ref Range Status  08/29/2023 1.20 (H) 0.57 - 1.00 mg/dL Final   BUN  Date Value Ref Range Status  08/29/2023 21 6 - 24 mg/dL Final   Potassium  Date Value Ref Range Status  08/29/2023 4.6 3.5 - 5.2 mmol/L Final   Sodium  Date Value Ref Range Status  08/29/2023 142 134 - 144 mmol/L Final   B Natriuretic Peptide  Date Value Ref Range Status  08/20/2023 1,570.7 (H) 0.0 - 100.0 pg/mL Final    Comment:    Performed at [facility], [address], [place], [place] [address]   Magnesium   Date Value Ref Range Status  08/23/2023 2.5 (H) 1.7 - 2.4 mg/dL Final    Comment:    Performed at [facility], [address] [place], [place] [address]   TSH  Date Value Ref Range Status  08/20/2023 3.773 0.350 - 4.500 uIU/mL Final    Comment:    Performed by a 3rd Generation assay with a functional sensitivity of <=0.01 uIU/mL. Performed at [facility], [address], [place], [place] [address]     Vital Signs: There were no vitals filed for this visit.  Assessment/Plan:  1. Chronic systolic heart failure:  - Per MD, unclear etiology, she does have frequent PVCs but based on review of her strips does not appear to be high enough to cause her decompensation. Preceding viral illness so myocarditis a possibility. - Dry on exam, can stop scheduled lasix . - Start losartan  25mg  daily - Continue metoprolol  succinate 25mg  daily - No SGLT-2 with frequent UTI - Continue spironolactone  25mg  daily - Consider zio patch at next visit - Maximally titrate GDMT prior to repeat echo, follow up with pharmacy in 2 weeks   2. Hypertension:  - Well controlled today. - Continue therapy as above   Follow up: ***  ***

## 2023-09-11 NOTE — Telephone Encounter (Signed)
 Called to confirm/remind patient of their appointment at the [facility] on 09/12/23.   Appointment:   [] Confirmed  [x] Left mess   [] No answer/No voice mail  [] VM Full/unable to leave message  [] Phone not in service  Patient reminded to bring all medications and/or complete list.  Confirmed patient has transportation. Gave directions, instructed to utilize valet parking.

## 2023-09-12 ENCOUNTER — Other Ambulatory Visit: Payer: Self-pay

## 2023-09-12 ENCOUNTER — Other Ambulatory Visit (HOSPITAL_COMMUNITY): Payer: Self-pay

## 2023-09-16 NOTE — Progress Notes (Unsigned)
 Advanced Heart Failure Clinic Note  PCP: Pcp, No PCP-Cardiologist: Sammy Crisp, MD HF-Cardiologist: Arta Lark, MD  HPI:  Martha Welch is a 58 y.o. female with a PMH of recently diagnosed HFrEF who presents for initial visit for further medication management of chronic systolic heart failure.  Patient recently admitted to the hospital for lower extremity swelling. She was admitted for a HF exacerbation, underwent extensive IV diuresis and had coronary angiography with normal coronaries. Symptoms started in November 2024 after a URI.   Seen for post-hospital follow-up with Dr. Alease Amend on 08/29/23. Since discharge, she had difficulty with vomiting and diarrhea, however, breathing had improved. Losartan  25 mg daily was started and furosemide  was decreased to prn.   Today Marina Shy returns to Heart Failure Clinic for pharmacist medication titration. Reports feeling much better overall. The nausea and vomiting she experienced at last visit has resolved. Reports mild L > R LEE several days ago that improved with furosemide . Right leg is not warm, inflamed, or painful to suggest DVT. Denies dizziness lightheadedness fatigue chest pain palpitations SOB orthopnea orthostasis PND. Reports being able to complete all activities of daily living (ADLs). Is somewhat active throughout the day. Weight at home is ~121 pounds. Takes furosemide  20 mg prn, has used twice since last visit with Dr. Alease Amend. Appetite is "too good". Somewhat follows a low sodium diet.  Current Heart Failure Medications: Loop diuretic: furosemide  20 mg prn Beta-Blocker: metoprolol  succinate 25 mg daily ACEI/ARB/ARNI: losartan  25 mg daily MRA: spironolactone  25 mg daily SGLT2i: none Other: none  Has the patient been experiencing any side effects to the medications prescribed? No  Does the patient have any problems obtaining medications due to transportation or finances? Yes. Patient just started a new job at [employer] and has not yet had a paycheck. She will not be able to pick up new medications until her next paycheck. Her BCBS plan is now active and will be eligible with copay cards for Entresto and SGLT2i. Current generic medications cost all together $23.  Understanding of regimen: Fair  Understanding of indications: Fair  Potential of adherence: Good  Patient understands to avoid NSAIDs.  Patient understands to avoid decongestants.  Pertinent Lab Values: Creatinine, Ser  Date Value Ref Range Status  08/29/2023 1.20 (H) 0.57 - 1.00 mg/dL Final   BUN  Date Value Ref Range Status  08/29/2023 21 6 - 24 mg/dL Final   Potassium  Date Value Ref Range Status  08/29/2023 4.6 3.5 - 5.2 mmol/L Final   Sodium  Date Value Ref Range Status  08/29/2023 142 134 - 144 mmol/L Final   B Natriuretic Peptide  Date Value Ref Range Status  08/20/2023 1,570.7 (H) 0.0 - 100.0 pg/mL Final    Comment:    Performed at [facility], [address] [place], [place] [address]   Magnesium   Date Value Ref Range Status  08/23/2023 2.5 (H) 1.7 - 2.4 mg/dL Final    Comment:    Performed at [facility], [address] [place], [place] [address]   TSH  Date Value Ref Range Status  08/20/2023 3.773 0.350 - 4.500 uIU/mL Final    Comment:    Performed by a 3rd Generation assay with a functional sensitivity of <=0.01 uIU/mL. Performed at [facility], [address] [place], [place] [address]     Vital Signs: Today's Vitals   09/18/23 1446  BP: 138/80  Pulse: 64  SpO2: 98%  Weight: 125 lb  9.6 oz (57 kg)    Assessment/Plan:  DATA REVIEW   ECG: 08/22/23: Sinus tachycardia with PVCs     ECHO: 08/22/23: LVEF 25-30%, RV function mildly reduced, small pericardial effusion   CATH: 08/22/23: Normal coronary arteries, normal PA pressures, normal filling pressures, Index of 2.2    Heart failure review: - Classification: Heart failure with reduced EF - Etiology:  Non-ischemic, work up ongoing - NYHA Class: II - Volume status: Euvolemic - ACEi/ARB/ARNI: Currently up-titrating - Aldosterone antagonist: Maximally tolerated dose - Beta-blocker: Currently up-titrating - Digoxin: Not indicated - Hydralazine /Nitrates: Not indicated - SGLT2i: Consider in future - GLP-1: Consider in future - Advanced therapies: Not needed at this time - ICD: Currently uptitrating GDMT     ASSESSMENT & PLAN:   Chronic systolic heart failure: Unclear etiology, she does have frequent PVCs but based on review of her strips, MD does not think it to be high enough to cause her decompensation. Preceding viral illness before presentation, so myocarditis a possibility. - Euvolemic on exam, can continue furosemide  20 mg prn. Suspect weight is up because she was severely dehydrated and not eating at last visit. - Increase losartan  to 50 mg daily. BMET today and in 2 weeks. Suspect creatinine bump and potassium at higher end of normal on 08/29/23 was due to AKI from N&V induced hypovolemia. Entresto not started today because patient reports she will not be able to afford the copay at this time. Can consider starting next visit after she receives her second paycheck at her current job. - Continue metoprolol  succinate 25mg  daily. HR 60s today. - Noted to have frequent UTIs, but after speaking with the patient, she reports one UTI ~1 year ago due to kidney stones. After being treated with antibiotics, a yeast infection occurred. Given this was provoked, could consider adding SGLT2i with close monitoring. - Continue spironolactone  25mg  daily  - Maximally titrate GDMT prior to repeat echo   Tobacco use:  - Encouraged cessation   Hypertension: Above goal today. - Continue therapy as above  Follow up: 1 month with Dr. Alease Amend. Will schedule closer follow-up with pharmacy pending labs today.  Please do not hesitate to reach out with questions or concerns,  Bevely Brush, PharmD, CPP,  BCPS Heart Failure Pharmacist  Phone - [phone] 09/18/2023 3:42 PM

## 2023-09-18 ENCOUNTER — Ambulatory Visit (HOSPITAL_COMMUNITY): Payer: Self-pay | Admitting: Cardiology

## 2023-09-18 ENCOUNTER — Other Ambulatory Visit: Payer: Self-pay

## 2023-09-18 ENCOUNTER — Ambulatory Visit (HOSPITAL_BASED_OUTPATIENT_CLINIC_OR_DEPARTMENT_OTHER): Payer: MEDICAID | Admitting: Pharmacist

## 2023-09-18 ENCOUNTER — Other Ambulatory Visit
Admission: RE | Admit: 2023-09-18 | Discharge: 2023-09-18 | Disposition: A | Discharge status: Home / Self Care | Payer: MEDICAID | Source: Ambulatory Visit | Attending: Cardiology | Admitting: Cardiology

## 2023-09-18 VITALS — BP 138/80 | HR 64 | Wt 125.6 lb

## 2023-09-18 DIAGNOSIS — I420 Dilated cardiomyopathy: Secondary | ICD-10-CM

## 2023-09-18 DIAGNOSIS — I5041 Acute combined systolic (congestive) and diastolic (congestive) heart failure: Secondary | ICD-10-CM

## 2023-09-18 LAB — BASIC METABOLIC PANEL WITH GFR
Anion gap: 9 (ref 5–15)
BUN: 14 mg/dL (ref 6–20)
CO2: 23 mmol/L (ref 22–32)
Calcium: 8.5 mg/dL — ABNORMAL LOW (ref 8.9–10.3)
Chloride: 110 mmol/L (ref 98–111)
Creatinine, Ser: 0.77 mg/dL (ref 0.44–1.00)
GFR, Estimated: >60 mL/min (ref >60)
Glucose, Bld: 105 mg/dL — ABNORMAL HIGH (ref 70–99)
Potassium: 4.2 mmol/L (ref 3.5–5.1)
Sodium: 142 mmol/L (ref 135–145)

## 2023-09-18 MED ORDER — LOSARTAN POTASSIUM 50 MG PO TABS
50.0000 mg | ORAL_TABLET | Freq: Every day | ORAL | 3 refills | Status: DC
  Filled 2023-09-18: qty 90, 90d supply, fill #0

## 2023-09-18 MED ORDER — METOPROLOL SUCCINATE ER 25 MG PO TB24
25.0000 mg | ORAL_TABLET | Freq: Every day | ORAL | 3 refills | Status: AC
  Filled 2023-09-18 – 2023-11-21 (×2): qty 90, 90d supply, fill #0
  Filled 2024-02-18: qty 90, 90d supply, fill #1
  Filled 2024-05-19: qty 90, 90d supply, fill #2

## 2023-09-18 MED ORDER — SPIRONOLACTONE 25 MG PO TABS
25.0000 mg | ORAL_TABLET | Freq: Every day | ORAL | 1 refills | Status: DC
  Filled 2023-09-18: qty 30, 30d supply, fill #0
  Filled 2023-10-23: qty 30, 30d supply, fill #1

## 2023-09-18 MED ORDER — FUROSEMIDE 20 MG PO TABS
20.0000 mg | ORAL_TABLET | Freq: Every day | ORAL | 2 refills | Status: AC | PRN
  Filled 2023-09-18 (×2): qty 30, 30d supply, fill #0

## 2023-09-18 NOTE — Patient Instructions (Signed)
 It was a pleasure seeing you today!  MEDICATIONS: -Increase losartan  to 50 mg daily. You can take two of your 25 mg tablets per day until you run out. -Call if you have questions about your medications.  LABS: -We will call you if your labs need attention.  NEXT APPOINTMENT: Return to clinic in 1 month to see Dr. Alease Amend.  In general, to take care of your heart failure: -Limit your fluid intake to 2 Liters (half-gallon) per day.   -Limit your salt intake to ideally 2-3 grams (2000-3000 mg) per day. -Weigh yourself daily and record, and bring that "weight diary" to your next appointment.  (Weight gain of 2-3 pounds in 1 day typically means fluid weight.) -The medications for your heart are to help your heart and help you live longer.   -Please contact us  before stopping any of your heart medications.  Call the clinic at [phone] with questions or to reschedule future appointments.

## 2023-09-19 ENCOUNTER — Other Ambulatory Visit: Payer: Self-pay

## 2023-10-16 ENCOUNTER — Telehealth: Payer: Self-pay | Admitting: Cardiology

## 2023-10-16 NOTE — Telephone Encounter (Signed)
 Called to confirm/remind patient of their appointment at the [facility] on 10/17/23.   Appointment:   [x] Confirmed  [] Left mess   [] No answer/No voice mail  [] VM Full/unable to leave message  [] Phone not in service  Patient reminded to bring all medications and/or complete list.  Confirmed patient has transportation. Gave directions, instructed to utilize valet parking.

## 2023-10-17 ENCOUNTER — Ambulatory Visit: Payer: MEDICAID | Attending: Cardiology | Admitting: Cardiology

## 2023-10-17 ENCOUNTER — Other Ambulatory Visit: Payer: Self-pay

## 2023-10-17 VITALS — BP 134/77 | HR 74 | Wt 126.0 lb

## 2023-10-17 DIAGNOSIS — I110 Hypertensive heart disease with heart failure: Secondary | ICD-10-CM | POA: Insufficient documentation

## 2023-10-17 DIAGNOSIS — I5022 Chronic systolic (congestive) heart failure: Secondary | ICD-10-CM | POA: Insufficient documentation

## 2023-10-17 DIAGNOSIS — I420 Dilated cardiomyopathy: Secondary | ICD-10-CM | POA: Diagnosis present

## 2023-10-17 DIAGNOSIS — I5041 Acute combined systolic (congestive) and diastolic (congestive) heart failure: Secondary | ICD-10-CM | POA: Diagnosis present

## 2023-10-17 DIAGNOSIS — F17200 Nicotine dependence, unspecified, uncomplicated: Secondary | ICD-10-CM | POA: Diagnosis not present

## 2023-10-17 MED ORDER — LOSARTAN POTASSIUM 100 MG PO TABS
100.0000 mg | ORAL_TABLET | Freq: Every day | ORAL | 3 refills | Status: AC
  Filled 2023-10-17 – 2023-11-21 (×2): qty 90, 90d supply, fill #0
  Filled 2024-02-18: qty 90, 90d supply, fill #1
  Filled 2024-05-19: qty 90, 90d supply, fill #2

## 2023-10-17 NOTE — Patient Instructions (Addendum)
 Medication Changes:  Start Losartan  100 MG once daily   Testing/Procedures:  Please have your echo completed Tuesday, September 9th at 9:00 AM. You will check in for this at the [facility]. You have to arrive 15 MINS EARLY for preparation, otherwise you will have to reschedule.   Follow-Up in: 3 months after echo.  Please have your echo completed. You will check in for this at the [facility]. You have to arrive 15 MINS EARLY for preparation, otherwise you will have to reschedule.   At the [facility], you and your health needs are our priority. We have a designated team specialized in the treatment of Heart Failure. This Care Team includes your primary Heart Failure Specialized Cardiologist (physician), Advanced Practice Providers (APPs- Physician Assistants and Nurse Practitioners), and Pharmacist who all work together to provide you with the care you need, when you need it.   You may see any of the following providers on your designated Care Team at your next follow up:  Dr. Toribio Fuel Dr. Ezra Shuck Dr. Ria Commander Dr. Odis Brownie Ellouise Class, FNP Jaun Bash, RPH-CPP  Please be sure to bring in all your medications bottles to every appointment.   Need to Contact Us :  If you have any questions or concerns before your next appointment please send us  a message through [place] or call our office at [phone].    TO LEAVE A MESSAGE FOR THE NURSE SELECT OPTION 2, PLEASE LEAVE A MESSAGE INCLUDING: YOUR NAME DATE OF BIRTH CALL BACK NUMBER REASON FOR CALL**this is important as we prioritize the call backs  YOU WILL RECEIVE A CALL BACK THE SAME DAY AS LONG AS YOU CALL BEFORE 4:00 PM

## 2023-10-17 NOTE — Progress Notes (Signed)
   ADVANCED HEART FAILURE FOLLOW UP CLINIC NOTE  Referring Physician: No ref. provider found  Primary Care: Pcp, No Primary Cardiologist:  HPI: Martha Welch is a 58 y.o. female who presents for follow up of chronic systolic heart failure.          Patient recently admitted to the hospital for lower extremity swelling. She was admitted for a HF exacerbation, underwent extensive IV diuresis and had coronary angiography with normal coronaries. Symptoms started in November 2024 after a URI.      SUBJECTIVE:  Feeling much better than she did prior.  Her GI symptoms have significantly improved, and now she feels very hungry after she takes her morning medications.  She feels she has more energy and is close to how she felt prior to the hospitalization.   PMH, current medications, allergies, social history, and family history reviewed in epic.  PHYSICAL EXAM: Vitals:   10/17/23 1504  BP: 134/77  Pulse: 74  SpO2: 99%   GENERAL: Well nourished and in no apparent distress at rest.  PULM:  Normal work of breathing, clear to auscultation bilaterally. Respirations are unlabored.  CARDIAC:  JVP: flat         Normal rate with regular rhythm. No murmurs, rubs or gallops.No edema. Warm and well perfused extremities. ABDOMEN: Soft, non-tender, non-distended. NEUROLOGIC: Patient is oriented x3 with no focal or lateralizing neurologic deficits.    DATA REVIEW  ECG: 08/22/23: Sinus tachycardia with PVCs     ECHO: 08/22/23: LVEF 25-30%, RV function mildly reduced, small pericardial effusion   CATH: 08/22/23: Normal coronary arteries, normal PA pressures, normal filling pressures, Index of 2.2   Heart failure review: - Classification: Heart failure with reduced EF - Etiology: Work up ongoing - NYHA Class: I - Volume status: Euvolemic - ACEi/ARB/ARNI: Currently up-titrating - Aldosterone antagonist: Maximally tolerated dose - Beta-blocker: Currently up-titrating - Digoxin: Not  indicated - Hydralazine /Nitrates: Not indicated - SGLT2i: Intolerant - GLP-1: Not a candidate - Advanced therapies: Not needed at this time - ICD: Currently uptitrating GDMT  ASSESSMENT & PLAN:  Chronic systolic heart failure: 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 Improving. NICM - Increase losartan  to 100mg  daily - Last BMP 6/4 with normalized creatinine - Continue metoprolol  succinate 25mg  daily - Continue spironolactone  25mg  daily - No SGLT-2 with frequent UTI - Repeat echo - If still depressed will need zio patch, further workup   Tobacco use:  - encouraged cessation   Hypertension: Mildly elevated BP today. - Increase losartan  as above    Follow up in 3 months with echo  Morene Brownie, MD Advanced Heart Failure Mechanical Circulatory Support 10/17/23

## 2023-10-23 ENCOUNTER — Other Ambulatory Visit: Payer: Self-pay

## 2023-11-21 ENCOUNTER — Other Ambulatory Visit: Payer: Self-pay

## 2023-11-21 ENCOUNTER — Other Ambulatory Visit: Payer: Self-pay | Admitting: Cardiology

## 2023-11-21 MED ORDER — SPIRONOLACTONE 25 MG PO TABS
25.0000 mg | ORAL_TABLET | Freq: Every day | ORAL | 1 refills | Status: DC
  Filled 2023-11-21: qty 30, 30d supply, fill #0
  Filled 2023-12-21: qty 30, 30d supply, fill #1

## 2023-12-21 ENCOUNTER — Other Ambulatory Visit: Payer: Self-pay

## 2023-12-24 ENCOUNTER — Ambulatory Visit
Admission: RE | Admit: 2023-12-24 | Discharge: 2023-12-24 | Disposition: A | Discharge status: Home / Self Care | Payer: MEDICAID | Source: Ambulatory Visit | Attending: Cardiology

## 2023-12-24 DIAGNOSIS — I340 Nonrheumatic mitral (valve) insufficiency: Secondary | ICD-10-CM | POA: Insufficient documentation

## 2023-12-24 DIAGNOSIS — I420 Dilated cardiomyopathy: Secondary | ICD-10-CM

## 2023-12-24 DIAGNOSIS — I517 Cardiomegaly: Secondary | ICD-10-CM | POA: Insufficient documentation

## 2023-12-24 DIAGNOSIS — F17210 Nicotine dependence, cigarettes, uncomplicated: Secondary | ICD-10-CM | POA: Diagnosis not present

## 2023-12-24 DIAGNOSIS — I493 Ventricular premature depolarization: Secondary | ICD-10-CM | POA: Insufficient documentation

## 2023-12-24 DIAGNOSIS — I5041 Acute combined systolic (congestive) and diastolic (congestive) heart failure: Secondary | ICD-10-CM

## 2023-12-24 DIAGNOSIS — I499 Cardiac arrhythmia, unspecified: Secondary | ICD-10-CM | POA: Diagnosis not present

## 2023-12-24 LAB — ECHOCARDIOGRAM COMPLETE
AR max vel: 2.9 cm2
AV Peak grad: 6.2 mmHg
Ao pk vel: 1.24 m/s
Area-P 1/2: 2.99 cm2
S' Lateral: 2.9 cm

## 2023-12-24 NOTE — Progress Notes (Signed)
 Echocardiogram 2D Echocardiogram has been performed.  Martha Welch 12/24/2023, 10:58 AM

## 2023-12-25 ENCOUNTER — Ambulatory Visit (HOSPITAL_COMMUNITY): Payer: Self-pay | Admitting: Cardiology

## 2024-01-23 ENCOUNTER — Other Ambulatory Visit: Payer: Self-pay

## 2024-01-23 ENCOUNTER — Other Ambulatory Visit: Payer: Self-pay | Admitting: Cardiology

## 2024-01-23 MED ORDER — SPIRONOLACTONE 25 MG PO TABS
25.0000 mg | ORAL_TABLET | Freq: Every day | ORAL | 1 refills | Status: DC
  Filled 2024-01-23: qty 30, 30d supply, fill #0
  Filled 2024-02-18: qty 30, 30d supply, fill #1

## 2024-02-18 ENCOUNTER — Other Ambulatory Visit: Payer: Self-pay

## 2024-03-22 ENCOUNTER — Other Ambulatory Visit: Payer: Self-pay | Admitting: Cardiology

## 2024-03-23 ENCOUNTER — Other Ambulatory Visit: Payer: Self-pay | Admitting: Cardiology

## 2024-03-23 ENCOUNTER — Other Ambulatory Visit: Payer: Self-pay

## 2024-03-23 MED ORDER — SPIRONOLACTONE 25 MG PO TABS
25.0000 mg | ORAL_TABLET | Freq: Every day | ORAL | 3 refills | Status: AC
  Filled 2024-03-23: qty 30, 30d supply, fill #0
  Filled 2024-04-22: qty 30, 30d supply, fill #1
  Filled 2024-05-19: qty 30, 30d supply, fill #2

## 2024-04-23 ENCOUNTER — Other Ambulatory Visit: Payer: Self-pay

## 2024-05-20 ENCOUNTER — Other Ambulatory Visit: Payer: Self-pay
